# Patient Record
Sex: Male | Born: 1985
Health system: Southern US, Community
[De-identification: ages and names within clinical notes are randomized; demographics above are authoritative.]

## PROBLEM LIST (undated history)

## (undated) DIAGNOSIS — M549 Dorsalgia, unspecified: Secondary | ICD-10-CM

## (undated) DIAGNOSIS — G8929 Other chronic pain: Secondary | ICD-10-CM

## (undated) HISTORY — PX: TONSILLECTOMY: SUR1361

---
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
## 2007-06-01 ENCOUNTER — Emergency Department (HOSPITAL_COMMUNITY): Admission: EM | Admit: 2007-06-01 | Discharge: 2007-06-01 | Payer: Self-pay | Admitting: Emergency Medicine

## 2008-05-19 ENCOUNTER — Emergency Department (HOSPITAL_COMMUNITY): Admission: EM | Admit: 2008-05-19 | Discharge: 2008-05-19 | Payer: Self-pay | Admitting: Emergency Medicine

## 2009-12-27 ENCOUNTER — Emergency Department (HOSPITAL_BASED_OUTPATIENT_CLINIC_OR_DEPARTMENT_OTHER): Admission: EM | Admit: 2009-12-27 | Discharge: 2009-12-27 | Payer: Self-pay | Admitting: Emergency Medicine

## 2010-05-31 ENCOUNTER — Emergency Department (HOSPITAL_COMMUNITY): Admission: EM | Admit: 2010-05-31 | Discharge: 2010-05-31 | Payer: Self-pay | Admitting: Emergency Medicine

## 2011-01-02 ENCOUNTER — Emergency Department (HOSPITAL_BASED_OUTPATIENT_CLINIC_OR_DEPARTMENT_OTHER)
Admission: EM | Admit: 2011-01-02 | Discharge: 2011-01-02 | Disposition: A | Discharge status: Home / Self Care | Payer: Self-pay | Attending: Emergency Medicine | Admitting: Emergency Medicine

## 2011-01-02 DIAGNOSIS — M549 Dorsalgia, unspecified: Secondary | ICD-10-CM | POA: Insufficient documentation

## 2011-01-02 DIAGNOSIS — M79609 Pain in unspecified limb: Secondary | ICD-10-CM | POA: Insufficient documentation

## 2011-01-02 DIAGNOSIS — F17200 Nicotine dependence, unspecified, uncomplicated: Secondary | ICD-10-CM | POA: Insufficient documentation

## 2011-01-02 DIAGNOSIS — G8929 Other chronic pain: Secondary | ICD-10-CM | POA: Insufficient documentation

## 2011-01-02 DIAGNOSIS — J45909 Unspecified asthma, uncomplicated: Secondary | ICD-10-CM | POA: Insufficient documentation

## 2011-06-08 ENCOUNTER — Emergency Department (HOSPITAL_BASED_OUTPATIENT_CLINIC_OR_DEPARTMENT_OTHER)
Admission: EM | Admit: 2011-06-08 | Discharge: 2011-06-08 | Disposition: A | Discharge status: Home / Self Care | Payer: Managed Care, Other (non HMO) | Attending: Emergency Medicine | Admitting: Emergency Medicine

## 2011-06-08 ENCOUNTER — Encounter: Payer: Self-pay | Admitting: *Deleted

## 2011-06-08 DIAGNOSIS — G8929 Other chronic pain: Secondary | ICD-10-CM | POA: Insufficient documentation

## 2011-06-08 DIAGNOSIS — J45909 Unspecified asthma, uncomplicated: Secondary | ICD-10-CM | POA: Insufficient documentation

## 2011-06-08 DIAGNOSIS — M549 Dorsalgia, unspecified: Secondary | ICD-10-CM | POA: Insufficient documentation

## 2011-06-08 DIAGNOSIS — F17200 Nicotine dependence, unspecified, uncomplicated: Secondary | ICD-10-CM | POA: Insufficient documentation

## 2011-06-08 DIAGNOSIS — K922 Gastrointestinal hemorrhage, unspecified: Secondary | ICD-10-CM | POA: Insufficient documentation

## 2011-06-08 DIAGNOSIS — M5430 Sciatica, unspecified side: Secondary | ICD-10-CM | POA: Insufficient documentation

## 2011-06-08 HISTORY — DX: Dorsalgia, unspecified: M54.9

## 2011-06-08 HISTORY — DX: Other chronic pain: G89.29

## 2011-06-08 LAB — CBC
HCT: 47.2 % (ref 39.0–52.0)
Hemoglobin: 16.9 g/dL (ref 13.0–17.0)
MCH: 31.4 pg (ref 26.0–34.0)
MCHC: 35.8 g/dL (ref 30.0–36.0)
MCV: 87.7 fL (ref 78.0–100.0)
RBC: 5.38 MIL/uL (ref 4.22–5.81)

## 2011-06-08 MED ORDER — HYDROCODONE-ACETAMINOPHEN 5-325 MG PO TABS: 1.0000 | ORAL_TABLET | ORAL | Status: AC | PRN

## 2011-06-08 NOTE — ED Notes (Signed)
Lower back pain into his right leg. Hx of back problems for a couple of years.
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
## 2011-06-09 NOTE — ED Provider Notes (Signed)
  I performed a history and physical examination of Justin Arnold and discussed his management with Ruby Cola.  I agree with the history, physical, assessment, and plan of care, with the following exceptions: None  Neurologically intact with no signs cauda equina on exam.  Able to ambulate and has normal reflexes.  No abd pain. Sx control.  Rectal tone checked and found to be hemoccult positive.  hgb normal.  Instructed to follow up  I was present for the following procedures: None Time Spent in Critical Care of the patient: None  Tildon Husky, MD 06/09/11 [phone]

## 2011-06-14 ENCOUNTER — Other Ambulatory Visit: Payer: Self-pay | Admitting: Neurosurgery

## 2011-06-14 DIAGNOSIS — M545 Low back pain, unspecified: Secondary | ICD-10-CM

## 2011-06-21 ENCOUNTER — Ambulatory Visit
Admission: RE | Admit: 2011-06-21 | Discharge: 2011-06-21 | Disposition: A | Discharge status: Home / Self Care | Payer: Managed Care, Other (non HMO) | Source: Ambulatory Visit | Attending: Neurosurgery | Admitting: Neurosurgery

## 2011-06-21 DIAGNOSIS — M545 Low back pain, unspecified: Secondary | ICD-10-CM

## 2013-01-27 ENCOUNTER — Emergency Department (HOSPITAL_BASED_OUTPATIENT_CLINIC_OR_DEPARTMENT_OTHER)
Admission: EM | Admit: 2013-01-27 | Discharge: 2013-01-27 | Disposition: A | Discharge status: Home / Self Care | Payer: Managed Care, Other (non HMO) | Attending: Emergency Medicine | Admitting: Emergency Medicine

## 2013-01-27 ENCOUNTER — Encounter (HOSPITAL_BASED_OUTPATIENT_CLINIC_OR_DEPARTMENT_OTHER): Payer: Self-pay | Admitting: *Deleted

## 2013-01-27 DIAGNOSIS — J45909 Unspecified asthma, uncomplicated: Secondary | ICD-10-CM | POA: Insufficient documentation

## 2013-01-27 DIAGNOSIS — M79609 Pain in unspecified limb: Secondary | ICD-10-CM | POA: Insufficient documentation

## 2013-01-27 DIAGNOSIS — L255 Unspecified contact dermatitis due to plants, except food: Secondary | ICD-10-CM | POA: Insufficient documentation

## 2013-01-27 DIAGNOSIS — R21 Rash and other nonspecific skin eruption: Secondary | ICD-10-CM | POA: Insufficient documentation

## 2013-01-27 DIAGNOSIS — L237 Allergic contact dermatitis due to plants, except food: Secondary | ICD-10-CM

## 2013-01-27 DIAGNOSIS — F17200 Nicotine dependence, unspecified, uncomplicated: Secondary | ICD-10-CM | POA: Insufficient documentation

## 2013-01-27 DIAGNOSIS — M79601 Pain in right arm: Secondary | ICD-10-CM

## 2013-01-27 DIAGNOSIS — R209 Unspecified disturbances of skin sensation: Secondary | ICD-10-CM | POA: Insufficient documentation

## 2013-01-27 DIAGNOSIS — Z8739 Personal history of other diseases of the musculoskeletal system and connective tissue: Secondary | ICD-10-CM | POA: Insufficient documentation

## 2013-01-27 MED ORDER — PREDNISONE 10 MG PO TABS: ORAL_TABLET | ORAL | Status: DC

## 2013-01-27 MED ORDER — METHYLPREDNISOLONE SODIUM SUCC 125 MG IJ SOLR
125.0000 mg | Freq: Once | INTRAMUSCULAR | Status: AC
  Administered 2013-01-27: 125 mg via INTRAMUSCULAR
  Filled 2013-01-27: qty 2

## 2013-01-27 MED ORDER — HYDROCODONE-ACETAMINOPHEN 5-325 MG PO TABS: 2.0000 | ORAL_TABLET | ORAL | Status: DC | PRN

## 2013-01-27 NOTE — ED Provider Notes (Signed)
Medical screening examination/treatment/procedure(s) were performed by non-physician practitioner and as supervising physician I was immediately available for consultation/collaboration.   Carleene Cooper III, MD 01/27/13 [phone]

## 2013-01-27 NOTE — ED Notes (Addendum)
Pt c/o poison ivy  X 3 days, also requesting referral to ortho for back pain

## 2013-01-27 NOTE — ED Provider Notes (Signed)
History     CSN: [id number]  Arrival date & time 01/27/13  1840   First MD Initiated Contact with Patient 01/27/13 2012      Chief Complaint  Patient presents with  . Poison Ivy    (Consider location/radiation/quality/duration/timing/severity/associated sxs/prior treatment) Patient is a 27 y.o. male presenting with poison ivy. The history is provided by the patient. No language interpreter was used.  Poison Justin Arnold This is a new problem. Episode onset: 3 days. The problem occurs constantly. The problem has been gradually worsening. Associated symptoms include a rash. Exacerbated by: lifting. He has tried nothing for the symptoms.   Pt also complains of pain in right arm and numbness.  Pt reports feels like the pain he had when he had sciatica in his leg.  Pt has poison ivy all over body.   Past Medical History  Diagnosis Date  . Chronic back pain   . Asthma     Past Surgical History  Procedure Laterality Date  . Tonsillectomy      History reviewed. No pertinent family history.  History  Substance Use Topics  . Smoking status: Current Every Day Smoker -- 0.50 packs/day    Types: Cigarettes  . Smokeless tobacco: Not on file  . Alcohol Use: No      Review of Systems  Skin: Positive for rash.  All other systems reviewed and are negative.    Allergies  Review of patient's allergies indicates no known allergies.  Home Medications  No current outpatient prescriptions on file.  BP 155/73  Pulse 83  Temp(Src) 98.5 F (36.9 C) (Oral)  Ht 5\' 9"  (1.753 m)  Wt 249 lb (112.946 kg)  BMI 36.75 kg/m2  SpO2 100%  Physical Exam  Nursing note and vitals reviewed. Constitutional: He appears well-developed and well-nourished.  HENT:  Head: Normocephalic and atraumatic.  Eyes: Conjunctivae are normal. Pupils are equal, round, and reactive to light.  Neck: Normal range of motion.  Cardiovascular: Normal rate and normal heart sounds.   Pulmonary/Chest: Effort normal.   Musculoskeletal: Normal range of motion.  Neurological: He is alert.  Skin: Rash noted. There is erythema.  Linear rashed rash, face to legs,  Psychiatric: He has a normal mood and affect.    ED Course  Procedures (including critical care time)  Labs Reviewed - No data to display No results found.   No diagnosis found.    MDM  Solumedrol IM.    Pt given rx for 12 day taper dose.   I will give a few hydrocodone for pain.   Pt referred to Dr. Ranell Patrick for evaluation        Elson Areas, PA-C 01/27/13 2029

## 2013-05-27 ENCOUNTER — Emergency Department (HOSPITAL_BASED_OUTPATIENT_CLINIC_OR_DEPARTMENT_OTHER)
Admission: EM | Admit: 2013-05-27 | Discharge: 2013-05-27 | Disposition: A | Discharge status: Home / Self Care | Payer: Managed Care, Other (non HMO) | Attending: Emergency Medicine | Admitting: Emergency Medicine

## 2013-05-27 ENCOUNTER — Encounter (HOSPITAL_BASED_OUTPATIENT_CLINIC_OR_DEPARTMENT_OTHER): Payer: Self-pay | Admitting: *Deleted

## 2013-05-27 DIAGNOSIS — J45909 Unspecified asthma, uncomplicated: Secondary | ICD-10-CM | POA: Insufficient documentation

## 2013-05-27 DIAGNOSIS — M545 Low back pain, unspecified: Secondary | ICD-10-CM | POA: Insufficient documentation

## 2013-05-27 DIAGNOSIS — G8911 Acute pain due to trauma: Secondary | ICD-10-CM | POA: Insufficient documentation

## 2013-05-27 DIAGNOSIS — F17200 Nicotine dependence, unspecified, uncomplicated: Secondary | ICD-10-CM | POA: Insufficient documentation

## 2013-05-27 MED ORDER — OXYCODONE-ACETAMINOPHEN 5-325 MG PO TABS
1.0000 | ORAL_TABLET | Freq: Once | ORAL | Status: AC
  Administered 2013-05-27: 1 via ORAL
  Filled 2013-05-27 (×2): qty 1

## 2013-05-27 MED ORDER — IBUPROFEN 600 MG PO TABS: 600.0000 mg | ORAL_TABLET | Freq: Three times a day (TID) | ORAL | Status: DC | PRN

## 2013-05-27 MED ORDER — IBUPROFEN 400 MG PO TABS
600.0000 mg | ORAL_TABLET | Freq: Once | ORAL | Status: AC
  Administered 2013-05-27: 600 mg via ORAL
  Filled 2013-05-27: qty 1

## 2013-05-27 MED ORDER — METHOCARBAMOL 500 MG PO TABS: 500.0000 mg | ORAL_TABLET | Freq: Two times a day (BID) | ORAL | Status: DC
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
## 2013-05-27 NOTE — ED Notes (Signed)
MD at bedside. 

## 2013-08-17 ENCOUNTER — Emergency Department (HOSPITAL_BASED_OUTPATIENT_CLINIC_OR_DEPARTMENT_OTHER)
Admission: EM | Admit: 2013-08-17 | Discharge: 2013-08-17 | Disposition: A | Discharge status: Home / Self Care | Payer: Managed Care, Other (non HMO) | Attending: Emergency Medicine | Admitting: Emergency Medicine

## 2013-08-17 ENCOUNTER — Encounter (HOSPITAL_BASED_OUTPATIENT_CLINIC_OR_DEPARTMENT_OTHER): Payer: Self-pay | Admitting: Emergency Medicine

## 2013-08-17 DIAGNOSIS — K089 Disorder of teeth and supporting structures, unspecified: Secondary | ICD-10-CM | POA: Insufficient documentation

## 2013-08-17 DIAGNOSIS — K047 Periapical abscess without sinus: Secondary | ICD-10-CM

## 2013-08-17 DIAGNOSIS — K044 Acute apical periodontitis of pulpal origin: Secondary | ICD-10-CM | POA: Insufficient documentation

## 2013-08-17 DIAGNOSIS — K0889 Other specified disorders of teeth and supporting structures: Secondary | ICD-10-CM

## 2013-08-17 DIAGNOSIS — F17200 Nicotine dependence, unspecified, uncomplicated: Secondary | ICD-10-CM | POA: Insufficient documentation

## 2013-08-17 DIAGNOSIS — J45909 Unspecified asthma, uncomplicated: Secondary | ICD-10-CM | POA: Insufficient documentation

## 2013-08-17 DIAGNOSIS — Z8739 Personal history of other diseases of the musculoskeletal system and connective tissue: Secondary | ICD-10-CM | POA: Insufficient documentation

## 2013-08-17 MED ORDER — HYDROCODONE-ACETAMINOPHEN 5-325 MG PO TABS: 1.0000 | ORAL_TABLET | ORAL | Status: DC | PRN

## 2013-08-17 MED ORDER — HYDROCODONE-ACETAMINOPHEN 5-325 MG PO TABS
2.0000 | ORAL_TABLET | Freq: Once | ORAL | Status: AC
  Administered 2013-08-17: 2 via ORAL
  Filled 2013-08-17: qty 2

## 2013-08-17 MED ORDER — AMOXICILLIN 500 MG PO CAPS: 500.0000 mg | ORAL_CAPSULE | Freq: Three times a day (TID) | ORAL | Status: DC

## 2013-08-17 NOTE — ED Notes (Signed)
C/o dental pain to lower left molar x several days.  Reports difficulty eating.

## 2013-08-17 NOTE — ED Provider Notes (Signed)
CSN: [id number]     Arrival date & time 08/17/13  1510 History   First MD Initiated Contact with Patient 08/17/13 1517     Chief Complaint  Patient presents with  . Dental Pain   (Consider location/radiation/quality/duration/timing/severity/associated sxs/prior Treatment) HPI Comments: Patient is a 27 year old male who presents to the emergency department complaining of right lower dental pain x3 days. Pain radiating throughout his lower jaw, described as sharp rated 10 out of 10, worse with eating. No difficulty swallowing. States the right side of his face looks swollen. Denies fever or chills. He has tried using Orajel without relief.  Patient is a 27 y.o. male presenting with tooth pain. The history is provided by the patient and a relative.  Dental Pain   Past Medical History  Diagnosis Date  . Chronic back pain   . Asthma    Past Surgical History  Procedure Laterality Date  . Tonsillectomy     No family history on file. History  Substance Use Topics  . Smoking status: Current Every Day Smoker -- 0.50 packs/day    Types: Cigarettes  . Smokeless tobacco: Not on file  . Alcohol Use: No    Review of Systems  HENT: Positive for dental problem.   All other systems reviewed and are negative.    Allergies  Review of patient's allergies indicates no known allergies.  Home Medications   Current Outpatient Rx  Name  Route  Sig  Dispense  Refill  . amoxicillin (AMOXIL) 500 MG capsule   Oral   Take 1 capsule (500 mg total) by mouth 3 (three) times daily.   21 capsule   0   . HYDROcodone-acetaminophen (NORCO/VICODIN) 5-325 MG per tablet   Oral   Take 1-2 tablets by mouth every 4 (four) hours as needed for pain.   6 tablet   0    There were no vitals taken for this visit. Physical Exam  Nursing note and vitals reviewed. Constitutional: He is oriented to person, place, and time. He appears well-developed and well-nourished. No distress.  HENT:  Head:  Normocephalic and atraumatic.  Mouth/Throat: Oropharynx is clear and moist.  Poor dentition throughout. TTP of right lower second molar with surrounding erythema and edema. No abscess.  Eyes: Conjunctivae are normal.  Neck: Normal range of motion. Neck supple.  Cardiovascular: Normal rate, regular rhythm and normal heart sounds.   Pulmonary/Chest: Effort normal and breath sounds normal.  Musculoskeletal: Normal range of motion. He exhibits no edema.  Lymphadenopathy:       Head (right side): Submandibular adenopathy present.  Neurological: He is alert and oriented to person, place, and time.  Skin: Skin is warm and dry. He is not diaphoretic.  Psychiatric: He has a normal mood and affect. His behavior is normal.    ED Course  Procedures (including critical care time) Labs Review Labs Reviewed - No data to display Imaging Review No results found.  EKG Interpretation   None       MDM   1. Pain, dental   2. Dental infection     Dental pain associated with dental infection. No evidence of dental abscess. Patient is afebrile, non toxic appearing and swallowing secretions well. I gave patient referral to dentist and stressed the importance of dental follow up for ultimate management of dental pain. I will also give amoxicillin and pain control. Patient voices understanding and is agreeable to plan.     Trevor Mace, PA-C 08/17/13 1528

## 2013-08-17 NOTE — ED Provider Notes (Signed)
Medical screening examination/treatment/procedure(s) were performed by non-physician practitioner and as supervising physician I was immediately available for consultation/collaboration.  EKG Interpretation   None         Soma Lizak B. Bernette Mayers, MD 08/17/13 1550

## 2013-10-09 ENCOUNTER — Encounter (HOSPITAL_BASED_OUTPATIENT_CLINIC_OR_DEPARTMENT_OTHER): Payer: Self-pay | Admitting: Emergency Medicine

## 2013-10-09 ENCOUNTER — Emergency Department (HOSPITAL_BASED_OUTPATIENT_CLINIC_OR_DEPARTMENT_OTHER)
Admission: EM | Admit: 2013-10-09 | Discharge: 2013-10-09 | Disposition: A | Discharge status: Home / Self Care | Payer: Managed Care, Other (non HMO) | Attending: Emergency Medicine | Admitting: Emergency Medicine

## 2013-10-09 DIAGNOSIS — F17200 Nicotine dependence, unspecified, uncomplicated: Secondary | ICD-10-CM | POA: Insufficient documentation

## 2013-10-09 DIAGNOSIS — K0889 Other specified disorders of teeth and supporting structures: Secondary | ICD-10-CM

## 2013-10-09 DIAGNOSIS — K029 Dental caries, unspecified: Secondary | ICD-10-CM

## 2013-10-09 DIAGNOSIS — K089 Disorder of teeth and supporting structures, unspecified: Secondary | ICD-10-CM | POA: Insufficient documentation

## 2013-10-09 DIAGNOSIS — Z792 Long term (current) use of antibiotics: Secondary | ICD-10-CM | POA: Insufficient documentation

## 2013-10-09 DIAGNOSIS — R599 Enlarged lymph nodes, unspecified: Secondary | ICD-10-CM | POA: Insufficient documentation

## 2013-10-09 DIAGNOSIS — G8929 Other chronic pain: Secondary | ICD-10-CM | POA: Insufficient documentation

## 2013-10-09 DIAGNOSIS — J45909 Unspecified asthma, uncomplicated: Secondary | ICD-10-CM | POA: Insufficient documentation

## 2013-10-09 MED ORDER — HYDROCODONE-ACETAMINOPHEN 5-325 MG PO TABS: 1.0000 | ORAL_TABLET | ORAL | Status: DC | PRN

## 2013-10-09 MED ORDER — HYDROCODONE-ACETAMINOPHEN 5-325 MG PO TABS
1.0000 | ORAL_TABLET | Freq: Once | ORAL | Status: AC
  Administered 2013-10-09: 1 via ORAL
  Filled 2013-10-09: qty 1

## 2013-10-09 MED ORDER — PENICILLIN V POTASSIUM 500 MG PO TABS: 500.0000 mg | ORAL_TABLET | Freq: Three times a day (TID) | ORAL | Status: DC

## 2013-10-09 MED ORDER — PENICILLIN V POTASSIUM 250 MG PO TABS
500.0000 mg | ORAL_TABLET | Freq: Once | ORAL | Status: AC
  Administered 2013-10-09: 500 mg via ORAL
  Filled 2013-10-09: qty 2

## 2013-10-09 NOTE — ED Notes (Signed)
Pt amb to triage with quick steady gait in nad. Pt reports tooth pain x 2 days, states he owes his dentist money so they won't see him.

## 2013-10-09 NOTE — ED Provider Notes (Signed)
CSN: [id number]     Arrival date & time 10/09/13  1741 History   First MD Initiated Contact with Patient 10/09/13 1756     Chief Complaint  Patient presents with  . Dental Pain   (Consider location/radiation/quality/duration/timing/severity/associated sxs/prior Treatment) Patient is a 27 y.o. male presenting with tooth pain. The history is provided by the patient. No language interpreter was used.  Dental Pain Location:  Upper Upper teeth location:  2/RU 2nd molar and 3/RU 1st molar Quality:  Pulsating and sharp Severity:  Moderate Associated symptoms: no facial swelling and no fever   Associated symptoms comment:  Dental pain upper right molars for the past 2 days without known injury or fracture. He denies fever. No difficulty swallowing. No drainage into his mouth.   Past Medical History  Diagnosis Date  . Chronic back pain   . Asthma    Past Surgical History  Procedure Laterality Date  . Tonsillectomy     History reviewed. No pertinent family history. History  Substance Use Topics  . Smoking status: Current Every Day Smoker -- 0.50 packs/day    Types: Cigarettes  . Smokeless tobacco: Not on file  . Alcohol Use: No    Review of Systems  Constitutional: Negative for fever and chills.  HENT: Negative for ear pain, facial swelling and trouble swallowing.        Toothache.  Gastrointestinal: Negative.   Musculoskeletal: Negative for myalgias.    Allergies  Review of patient's allergies indicates no known allergies.  Home Medications   Current Outpatient Rx  Name  Route  Sig  Dispense  Refill  . amoxicillin (AMOXIL) 500 MG capsule   Oral   Take 1 capsule (500 mg total) by mouth 3 (three) times daily.   21 capsule   0   . HYDROcodone-acetaminophen (NORCO/VICODIN) 5-325 MG per tablet   Oral   Take 1-2 tablets by mouth every 4 (four) hours as needed for pain.   6 tablet   0    BP 143/73  Pulse 83  Temp(Src) 98.5 F (36.9 C) (Oral)  Resp 18  Ht 5\' 9"   (1.753 m)  Wt 230 lb (104.327 kg)  BMI 33.95 kg/m2  SpO2 100% Physical Exam  Constitutional: He is oriented to person, place, and time. He appears well-developed and well-nourished.  HENT:  Widespread dental decay. Significant tartar build up on upper molars generally, with redness and tenderness to gingiva on right upper ridge.   Neck: Normal range of motion.  Pulmonary/Chest: Effort normal.  Musculoskeletal: Normal range of motion.  Lymphadenopathy:    He has cervical adenopathy.  Neurological: He is alert and oriented to person, place, and time.  Skin: Skin is warm and dry.  Psychiatric: He has a normal mood and affect.    ED Course  Procedures (including critical care time) Labs Review Labs Reviewed - No data to display Imaging Review No results found.  EKG Interpretation   None       MDM  No diagnosis found. 1. Dental decay 2. Dental pain  Uncomplicated dental pain with significant decay.    Arnoldo Hooker, PA-C 10/09/13 [phone]

## 2013-10-09 NOTE — ED Provider Notes (Signed)
Medical screening examination/treatment/procedure(s) were performed by non-physician practitioner and as supervising physician I was immediately available for consultation/collaboration.  EKG Interpretation   None         Nazly Digilio, MD 10/09/13 1845 

## 2014-01-11 ENCOUNTER — Encounter (HOSPITAL_BASED_OUTPATIENT_CLINIC_OR_DEPARTMENT_OTHER): Payer: Self-pay | Admitting: Emergency Medicine

## 2014-01-11 ENCOUNTER — Emergency Department (HOSPITAL_BASED_OUTPATIENT_CLINIC_OR_DEPARTMENT_OTHER)
Admission: EM | Admit: 2014-01-11 | Discharge: 2014-01-11 | Disposition: A | Discharge status: Home / Self Care | Payer: Managed Care, Other (non HMO) | Attending: Emergency Medicine | Admitting: Emergency Medicine

## 2014-01-11 DIAGNOSIS — J45909 Unspecified asthma, uncomplicated: Secondary | ICD-10-CM | POA: Insufficient documentation

## 2014-01-11 DIAGNOSIS — K029 Dental caries, unspecified: Secondary | ICD-10-CM | POA: Insufficient documentation

## 2014-01-11 DIAGNOSIS — K089 Disorder of teeth and supporting structures, unspecified: Secondary | ICD-10-CM | POA: Insufficient documentation

## 2014-01-11 DIAGNOSIS — Z792 Long term (current) use of antibiotics: Secondary | ICD-10-CM | POA: Insufficient documentation

## 2014-01-11 DIAGNOSIS — F17200 Nicotine dependence, unspecified, uncomplicated: Secondary | ICD-10-CM | POA: Insufficient documentation

## 2014-01-11 DIAGNOSIS — G8929 Other chronic pain: Secondary | ICD-10-CM | POA: Insufficient documentation

## 2014-01-11 DIAGNOSIS — K0889 Other specified disorders of teeth and supporting structures: Secondary | ICD-10-CM

## 2014-01-11 MED ORDER — HYDROCODONE-ACETAMINOPHEN 5-325 MG PO TABS: 1.0000 | ORAL_TABLET | ORAL | Status: DC | PRN

## 2014-01-11 MED ORDER — AMOXICILLIN 500 MG PO CAPS: 500.0000 mg | ORAL_CAPSULE | Freq: Three times a day (TID) | ORAL | Status: DC

## 2014-01-11 NOTE — Discharge Instructions (Signed)

## 2014-01-11 NOTE — ED Provider Notes (Signed)
CSN: [id number]     Arrival date & time 01/11/14  1500 History   First MD Initiated Contact with Patient 01/11/14 1708     Chief Complaint  Patient presents with  . Dental Pain     (Consider location/radiation/quality/duration/timing/severity/associated sxs/prior Treatment) Patient is a [age] y.o. male presenting with tooth pain. The history is provided by the patient. No language interpreter was used.  Dental Pain Location:  Lower Quality:  Aching Severity:  Moderate Onset quality:  Gradual Timing:  Constant Progression:  Worsening Chronicity:  New Relieved by:  Nothing Ineffective treatments:  None tried Associated symptoms: gum swelling   Pt complains of dental pain.   Pt seen here in Dec and had 2 teeth pulled.  Pt has continued problems with multiple teeth.  Past Medical History  Diagnosis Date  . Chronic back pain   . Asthma    Past Surgical History  Procedure Laterality Date  . Tonsillectomy     No family history on file. History  Substance Use Topics  . Smoking status: Current Every Day Smoker -- 0.50 packs/day    Types: Cigarettes  . Smokeless tobacco: Never Used  . Alcohol Use: No    Review of Systems  HENT: Positive for dental problem.   All other systems reviewed and are negative.      Allergies  Review of patient's allergies indicates no known allergies.  Home Medications   Current Outpatient Rx  Name  Route  Sig  Dispense  Refill  . amoxicillin (AMOXIL) 500 MG capsule   Oral   Take 1 capsule (500 mg total) by mouth 3 (three) times daily.   21 capsule   0   . HYDROcodone-acetaminophen (NORCO/VICODIN) 5-325 MG per tablet   Oral   Take 1-2 tablets by mouth every 4 (four) hours as needed for pain.   6 tablet   0   . HYDROcodone-acetaminophen (NORCO/VICODIN) 5-325 MG per tablet   Oral   Take 1-2 tablets by mouth every 4 (four) hours as needed.   12 tablet   0   . penicillin v potassium (VEETID) 500 MG tablet   Oral   Take 1 tablet  (500 mg total) by mouth 3 (three) times daily.   30 tablet   0    BP 150/74  Pulse 70  Temp(Src) 98.8 F (37.1 C) (Oral)  Resp 20  Ht 5\' 9"  (1.753 m)  Wt 230 lb (104.327 kg)  BMI 33.95 kg/m2  SpO2 100% Physical Exam  Constitutional: He appears well-developed and well-nourished.  HENT:  Head: Normocephalic and atraumatic.  Multiple dental caries, gingival swelling  Eyes: Conjunctivae are normal. Pupils are equal, round, and reactive to light.  Cardiovascular: Normal rate.   Pulmonary/Chest: Effort normal.  Musculoskeletal: Normal range of motion.  Neurological: He is alert.  Skin: Skin is warm.    ED Course  Procedures (including critical care time) Labs Review Labs Reviewed - No data to display Imaging Review No results found.   EKG Interpretation None      MDM   Final diagnoses:  Toothache    amoxicillian and hydrocodone.   I advised pt he needs to see dentist.  No dental call currently.   Pt given number for Dr. Nance Pewivils    Labria Wos K Altagracia Rone, PA-C 01/11/14 1732

## 2014-01-11 NOTE — ED Notes (Signed)
Right side dental since middle of last week

## 2014-01-12 NOTE — ED Provider Notes (Signed)
Medical screening examination/treatment/procedure(s) were performed by non-physician practitioner and as supervising physician I was immediately available for consultation/collaboration.    Toshua Honsinger E Sohrab Keelan, MD 01/12/14 1506 

## 2017-03-22 ENCOUNTER — Encounter (HOSPITAL_BASED_OUTPATIENT_CLINIC_OR_DEPARTMENT_OTHER): Payer: Self-pay | Admitting: *Deleted

## 2017-03-22 ENCOUNTER — Emergency Department (HOSPITAL_BASED_OUTPATIENT_CLINIC_OR_DEPARTMENT_OTHER)
Admission: EM | Admit: 2017-03-22 | Discharge: 2017-03-22 | Disposition: A | Discharge status: Home / Self Care | Payer: Managed Care, Other (non HMO) | Attending: Emergency Medicine | Admitting: Emergency Medicine

## 2017-03-22 DIAGNOSIS — K0889 Other specified disorders of teeth and supporting structures: Secondary | ICD-10-CM | POA: Insufficient documentation

## 2017-03-22 DIAGNOSIS — F17210 Nicotine dependence, cigarettes, uncomplicated: Secondary | ICD-10-CM | POA: Insufficient documentation

## 2017-03-22 DIAGNOSIS — J45909 Unspecified asthma, uncomplicated: Secondary | ICD-10-CM | POA: Insufficient documentation

## 2017-03-22 MED ORDER — BUPIVACAINE-EPINEPHRINE (PF) 0.5% -1:200000 IJ SOLN
1.8000 mL | Freq: Once | INTRAMUSCULAR | Status: AC
  Administered 2017-03-22: 1.8 mL
  Filled 2017-03-22: qty 1.8

## 2017-03-22 MED ORDER — PENICILLIN V POTASSIUM 500 MG PO TABS: 500.0000 mg | ORAL_TABLET | Freq: Two times a day (BID) | ORAL | 0 refills | Status: AC

## 2017-03-22 MED ORDER — IBUPROFEN 600 MG PO TABS: 600.0000 mg | ORAL_TABLET | Freq: Four times a day (QID) | ORAL | 0 refills | Status: DC | PRN

## 2017-03-22 NOTE — ED Notes (Signed)
ED Provider at bedside. 

## 2017-03-22 NOTE — ED Notes (Signed)
Patient's tooth broke last while [facility].  Per patient, his dentist in [place] told him that all of his teeth needs to be pulled and this was 2 years ago. Patient took Aleve last without any relief.

## 2017-03-22 NOTE — ED Triage Notes (Signed)
Swelling to the left side of his face since this am. States his left upper molar broke last night.

## 2017-03-22 NOTE — ED Provider Notes (Signed)
[facility] Provider Note   CSN: [id number] Arrival date & time: 03/22/17  1844   By signing my name below, I, [name], attest that this documentation has been prepared under the direction and in the presence of Melburn Hake, [place]. Electronically Signed: [name], Scribe. 03/22/17. 9:05 PM.   History   Chief Complaint Chief Complaint  Patient presents with  . Dental Pain   The history is provided by the patient and medical records. No language interpreter was used.    Justin Arnold is a 31 y.o. male with h/o poor dentition presenting to the Emergency Department with chief complaint of L upper dental pain gradually wosening since last night. He states a tooth chipped last night while eating. Swelling noted to affected area. he also notes the sensation of a pocket to the affected area and sour taste sensation. He describes 8/10 constant tight throbbing worse with eating and contact. No other modifying factors noted. He states he has been told in the past that he would need to get "all of [his] teeth pulled" in the past. No difficulty swallowing, vomiting, difficulty breathing or fever. No dental specialist noted for F/U. No other complaints at this time.    Past Medical History:  Diagnosis Date  . Asthma   . Chronic back pain     There are no active problems to display for this patient.   Past Surgical History:  Procedure Laterality Date  . TONSILLECTOMY         Home Medications    Prior to Admission medications   Medication Sig Start Date End Date Taking? Authorizing Provider  amoxicillin (AMOXIL) 500 MG capsule Take 1 capsule (500 mg total) by mouth 3 (three) times daily. 01/11/14   Elson Areas, PA-C  HYDROcodone-acetaminophen (NORCO/VICODIN) 5-325 MG per tablet Take 1-2 tablets by mouth every 4 (four) hours as needed for pain. 08/17/13   Hess, Nada Boozer, PA-C  HYDROcodone-acetaminophen (NORCO/VICODIN) 5-325 MG per tablet Take 1-2  tablets by mouth every 4 (four) hours as needed. 01/11/14   Elson Areas, PA-C  ibuprofen (ADVIL,MOTRIN) 600 MG tablet Take 1 tablet (600 mg total) by mouth every 6 (six) hours as needed. 03/22/17   Barrett Henle, PA-C  penicillin v potassium (VEETID) 500 MG tablet Take 1 tablet (500 mg total) by mouth 2 (two) times daily. 03/22/17 03/29/17  Barrett Henle, PA-C    Family History No family history on file.  Social History Social History  Substance Use Topics  . Smoking status: Current Every Day Smoker    Packs/day: 0.50    Types: Cigarettes  . Smokeless tobacco: Never Used  . Alcohol use No     Allergies   Patient has no known allergies.   Review of Systems Review of Systems  Constitutional: Negative for fever.  HENT: Positive for dental problem, facial swelling and mouth sores. Negative for trouble swallowing.   Respiratory: Negative for shortness of breath.   Gastrointestinal: Negative for nausea and vomiting.  All other systems reviewed and are negative.    Physical Exam Updated Vital Signs BP 139/75 (BP Location: Left Arm)   Pulse 98   Temp 97.9 F (36.6 C) (Oral)   Resp 16   Ht 5\' 9"  (1.753 m)   Wt 230 lb (104.3 kg)   SpO2 100%   BMI 33.97 kg/m   Physical Exam  Constitutional: He is oriented to person, place, and time. He appears well-developed and well-nourished. No distress.  HENT:  Head: Normocephalic and atraumatic.  Mouth/Throat: Uvula is midline, oropharynx is clear and moist and mucous membranes are normal. No oral lesions. No trismus in the jaw. Abnormal dentition. Dental caries present. No dental abscesses or uvula swelling. No oropharyngeal exudate, posterior oropharyngeal edema, posterior oropharyngeal erythema or tonsillar abscesses. No tonsillar exudate.    No trismus, drooling, facial/neck swelling or stridor on exam. No muffled voice. Floor of mouth soft. No facial or neck swelling.  Eyes: Conjunctivae and EOM are normal.  Right eye exhibits no discharge. Left eye exhibits no discharge. No scleral icterus.  Pulmonary/Chest: Effort normal.  Neurological: He is alert and oriented to person, place, and time.  Skin: He is not diaphoretic.  Nursing note and vitals reviewed.    ED Treatments / Results  DIAGNOSTIC STUDIES: Oxygen Saturation is 100% on RA, NL by my interpretation.    COORDINATION OF CARE: 8:58 PM-Discussed next steps with pt. Pt verbalized understanding and is agreeable with the plan. Will order medications and provide dental resources.   Labs (all labs ordered are listed, but only abnormal results are displayed) Labs Reviewed - No data to display  EKG  EKG Interpretation None       Radiology No results found.  Procedures .Nerve Block Date/Time: 03/22/2017 9:20 PM Performed by: Barrett HenleNADEAU, Akayla Brass ELIZABETH Authorized by: Barrett HenleNADEAU, Josephmichael Lisenbee ELIZABETH   Consent:    Consent obtained:  Verbal   Consent given by:  Patient Indications:    Indications:  Pain relief Location:    Nerve block body site: dental block. Procedure details (see MAR for exact dosages):    Block needle gauge:  27 G   Anesthetic injected:  Bupivacaine 0.5% WITH epi   Injection procedure:  Anatomic landmarks identified Post-procedure details:    Dressing:  None   Outcome:  Pain improved   Patient tolerance of procedure:  Tolerated well, no immediate complications    (including critical care time)  Medications Ordered in ED Medications  bupivacaine-epinephrine (MARCAINE W/ EPI) 0.5% -1:200000 injection 1.8 mL (1.8 mLs Infiltration Given by Other 03/22/17 2121)     Initial Impression / Assessment and Plan / ED Course  I have reviewed the triage vital signs and the nursing notes.  Pertinent labs & imaging results that were available during my care of the patient were reviewed by me and considered in my medical decision making (see chart for details).     Patient with toothache.  No gross abscess.  Exam  unconcerning for Ludwig's angina or spread of infection.  Will treat with penicillin and pain medicine.  Urged patient to follow-up with dentist.     Final Clinical Impressions(s) / ED Diagnoses   Final diagnoses:  Pain, dental    New Prescriptions New Prescriptions   IBUPROFEN (ADVIL,MOTRIN) 600 MG TABLET    Take 1 tablet (600 mg total) by mouth every 6 (six) hours as needed.   PENICILLIN V POTASSIUM (VEETID) 500 MG TABLET    Take 1 tablet (500 mg total) by mouth 2 (two) times daily.   I personally performed the services described in this documentation, which was scribed in my presence. The recorded information has been reviewed and is accurate.    Barrett Henleadeau, Cliffton Spradley Elizabeth, PA-C 03/22/17 2127    Tegeler, Canary Brimhristopher J, MD 03/23/17 0100

## 2017-03-22 NOTE — Discharge Instructions (Signed)
Take medications as prescribed. You may also take 600 mg ibuprofen 4 times daily as needed for pain relief. I recommend eating prior to taking ibuprofen to prevent gastrointestinal side effects. You may apply ice to affected area for 15-20 minutes 3-4 times daily to help with pain. °Follow-up with one of the dental clinics listed below for further management of your dental pain. °Return to the emergency department if symptoms worsen or new onset of fever, headache, neck stiffness, facial/neck swelling, unable to open jaw, unable to swallow resulting in drooling, difficulty breathing, drainage, unable to tolerate fluids.  ° °[employer] °[employer] °[facility] °[address] °[place], [place] [address] °Phone [phone] ° °[facility] [employer] [facility] in [place], [place], exemplifies the Dental School?s vision to improve the health and quality of life of all [place] by creating leaders with a passion to care for the underserved and by leading the nation in community-based, service learning oral health education. ° °We are committed to offering comprehensive general dental services for adults, children and special needs patients in a safe, caring and professional setting. ° ° °Appointments: Our clinic is open Monday through Friday 8:00 a.m. until 5:00 p.m. The amount of time scheduled for an appointment depends on the patient?s specific needs. We ask that you keep your appointed time for care or provide 24-hour notice of all appointment changes. Parents or legal guardians must accompany minor children. °  °Payment for Services: Medicaid and other insurance plans are welcome. Payment for services is due when services are rendered and may be made by cash or credit card. If you have dental insurance, we will assist you with your claim submission. °   °Emergencies:   Emergency services will be provided Monday through Friday on a walk-in basis.  Please arrive early for emergency services. After hours emergency services will be provided for patients of record as required. °  °Services:  °Comprehensive General Dentistry °Children?s Dentistry °Oral Surgery - Extractions °Root Canals °Sealants and Tooth Colored Fillings °Crowns and Bridges °Dentures and Partial Dentures °Implant Services °Periodontal Services and Cleanings °Cosmetic Tooth Whitening °Digital Radiography °3-D/[facility]  °

## 2017-10-11 ENCOUNTER — Other Ambulatory Visit: Payer: Self-pay

## 2017-10-11 ENCOUNTER — Encounter (HOSPITAL_BASED_OUTPATIENT_CLINIC_OR_DEPARTMENT_OTHER): Payer: Self-pay

## 2017-10-11 ENCOUNTER — Emergency Department (HOSPITAL_BASED_OUTPATIENT_CLINIC_OR_DEPARTMENT_OTHER)
Admission: EM | Admit: 2017-10-11 | Discharge: 2017-10-11 | Disposition: A | Discharge status: Home / Self Care | Payer: Managed Care, Other (non HMO) | Attending: Emergency Medicine | Admitting: Emergency Medicine

## 2017-10-11 DIAGNOSIS — J45909 Unspecified asthma, uncomplicated: Secondary | ICD-10-CM | POA: Insufficient documentation

## 2017-10-11 DIAGNOSIS — F17210 Nicotine dependence, cigarettes, uncomplicated: Secondary | ICD-10-CM | POA: Insufficient documentation

## 2017-10-11 DIAGNOSIS — B9789 Other viral agents as the cause of diseases classified elsewhere: Secondary | ICD-10-CM

## 2017-10-11 DIAGNOSIS — J069 Acute upper respiratory infection, unspecified: Secondary | ICD-10-CM

## 2017-10-11 MED ORDER — ALBUTEROL SULFATE HFA 108 (90 BASE) MCG/ACT IN AERS
2.0000 | INHALATION_SPRAY | RESPIRATORY_TRACT | Status: DC | PRN
  Administered 2017-10-11: 2 via RESPIRATORY_TRACT
  Filled 2017-10-11: qty 6.7

## 2017-10-11 MED ORDER — BENZONATATE 100 MG PO CAPS: 100.0000 mg | ORAL_CAPSULE | Freq: Three times a day (TID) | ORAL | 0 refills | Status: DC

## 2017-10-11 MED ORDER — NAPROXEN 500 MG PO TABS: 500.0000 mg | ORAL_TABLET | Freq: Two times a day (BID) | ORAL | 0 refills | Status: DC

## 2017-10-11 NOTE — Discharge Instructions (Signed)
Please read and follow all provided instructions.  Your diagnoses today include:  1. Viral URI with cough     You appear to have an upper respiratory infection (URI). An upper respiratory tract infection, or cold, is a viral infection of the air passages leading to the lungs. It should improve gradually after 5-7 days. You may have a lingering cough that lasts for 2- 4 weeks after the infection.  Tests performed today include:  Vital signs. See below for your results today.   Medications prescribed:   Naproxen - anti-inflammatory pain medication  Do not exceed 500mg  naproxen every 12 hours, take with food  You have been prescribed an anti-inflammatory medication or NSAID. Take with food. Take smallest effective dose for the shortest duration needed for your pain. Stop taking if you experience stomach pain or vomiting.    Tessalon Perles - cough suppressant medication   Albuterol inhaler - medication that opens up your airway  Use inhaler as follows: 1-2 puffs with spacer every 4 hours as needed for wheezing, cough, or shortness of breath.   Take any prescribed medications only as directed. Treatment for your infection is aimed at treating the symptoms. There are no medications, such as antibiotics, that will cure your infection.   Home care instructions:  Follow any educational materials contained in this packet.   Your illness is contagious and can be spread to others, especially during the first 3 or 4 days. It cannot be cured by antibiotics or other medicines. Take basic precautions such as washing your hands often, covering your mouth when you cough or sneeze, and avoiding public places where you could spread your illness to others.   Please continue drinking plenty of fluids.  Use over-the-counter medicines as needed as directed on packaging for symptom relief.  You may also use ibuprofen or tylenol as directed on packaging for pain or fever.  Do not take multiple medicines  containing Tylenol or acetaminophen to avoid taking too much of this medication.  Follow-up instructions: Please follow-up with your primary care provider in the next 3 days for further evaluation of your symptoms if you are not feeling better.   Return instructions:   Please return to the Emergency Department if you experience worsening symptoms.   RETURN IMMEDIATELY IF you develop shortness of breath, confusion or altered mental status, a new rash, become dizzy, faint, or poorly responsive, or are unable to be cared for at home.  Please return if you have persistent vomiting and cannot keep down fluids or develop a fever that is not controlled by tylenol or motrin.    Please return if you have any other emergent concerns.  Additional Information:  Your vital signs today were: BP 137/83 (BP Location: Right Arm)    Pulse 100    Temp 97.6 F (36.4 C) (Oral)    Resp 18    Ht 5\' 8"  (1.727 m)    Wt 90.7 kg (200 lb)    SpO2 100%    BMI 30.41 kg/m  If your blood pressure (BP) was elevated above 135/85 this visit, please have this repeated by your doctor within one month. --------------

## 2017-10-11 NOTE — ED Triage Notes (Signed)
Pt c/o cough, congestion, runny nose x 3 days

## 2017-10-11 NOTE — ED Provider Notes (Signed)
[facility] EMERGENCY DEPARTMENT Provider Note   CSN: [id number] Arrival date & time: 10/11/17  1553     History   Chief Complaint Chief Complaint  Patient presents with  . URI    HPI Aldona LentoChristopher A Frasier is a 31 y.o. male.  Patient with history of asthma presents with 3-day history of ear pain, runny nose, nasal congestion, sore throat, cough and wheezing.  Patient denies fever, nausea, vomiting, or diarrhea.  No shortness of breath or abdominal pain.  No urinary symptoms or rash.  No treatments prior to arrival.  Patient used to be on albuterol but states that he currently does not have an inhaler.  He has been under a lot of stress recently due to the death of a family member.  No known sick contacts. The onset of this condition was acute. The course is constant. Aggravating factors: none. Alleviating factors: none.        Past Medical History:  Diagnosis Date  . Asthma   . Chronic back pain     There are no active problems to display for this patient.   Past Surgical History:  Procedure Laterality Date  . TONSILLECTOMY         Home Medications    Prior to Admission medications   Medication Sig Start Date End Date Taking? Authorizing Provider  benzonatate (TESSALON) 100 MG capsule Take 1 capsule (100 mg total) by mouth every 8 (eight) hours. 10/11/17   Renne CriglerGeiple, Kenndra Morris, PA-C  naproxen (NAPROSYN) 500 MG tablet Take 1 tablet (500 mg total) by mouth 2 (two) times daily. 10/11/17   Renne CriglerGeiple, Leaner Morici, PA-C    Family History No family history on file.  Social History Social History   Tobacco Use  . Smoking status: Current Every Day Smoker    Packs/day: 0.50    Types: Cigarettes  . Smokeless tobacco: Never Used  Substance Use Topics  . Alcohol use: No  . Drug use: No     Allergies   Patient has no known allergies.   Review of Systems Review of Systems  Constitutional: Negative for chills, fatigue and fever.  HENT: Positive for congestion,  ear pain, rhinorrhea and sore throat. Negative for sinus pressure.   Eyes: Negative for redness.  Respiratory: Positive for cough and wheezing.   Gastrointestinal: Negative for abdominal pain, diarrhea, nausea and vomiting.  Genitourinary: Negative for dysuria.  Musculoskeletal: Negative for myalgias and neck stiffness.  Skin: Negative for rash.  Neurological: Negative for headaches.  Hematological: Negative for adenopathy.     Physical Exam Updated Vital Signs BP 137/83 (BP Location: Right Arm)   Pulse 100   Temp 97.6 F (36.4 C) (Oral)   Resp 18   Ht 5\' 8"  (1.727 m)   Wt 90.7 kg (200 lb)   SpO2 100%   BMI 30.41 kg/m   Physical Exam  Constitutional: He appears well-developed and well-nourished.  HENT:  Head: Normocephalic and atraumatic.  Right Ear: Tympanic membrane, external ear and ear canal normal.  Left Ear: Tympanic membrane, external ear and ear canal normal.  Nose: Nose normal. No mucosal edema or rhinorrhea.  Mouth/Throat: Uvula is midline and mucous membranes are normal. Mucous membranes are not dry. No trismus in the jaw. No uvula swelling. Posterior oropharyngeal edema present. No oropharyngeal exudate, posterior oropharyngeal erythema or tonsillar abscesses. No tonsillar exudate.  Eyes: Conjunctivae are normal. Right eye exhibits no discharge. Left eye exhibits no discharge.  Neck: Normal range of motion. Neck supple.  Cardiovascular: Normal  rate, regular rhythm and normal heart sounds.  Pulmonary/Chest: Effort normal and breath sounds normal. No respiratory distress. He has no wheezes. He has no rales.  Abdominal: Soft. There is no tenderness.  Neurological: He is alert.  Skin: Skin is warm and dry.  Psychiatric: He has a normal mood and affect.  Nursing note and vitals reviewed.    ED Treatments / Results  Labs (all labs ordered are listed, but only abnormal results are displayed) Labs Reviewed - No data to display  EKG  EKG Interpretation None        Radiology No results found.  Procedures Procedures (including critical care time)  Medications Ordered in ED Medications  albuterol (PROVENTIL HFA;VENTOLIN HFA) 108 (90 Base) MCG/ACT inhaler 2 puff (2 puffs Inhalation Given 10/11/17 1630)     Initial Impression / Assessment and Plan / ED Course  I have reviewed the triage vital signs and the nursing notes.  Pertinent labs & imaging results that were available during my care of the patient were reviewed by me and considered in my medical decision making (see chart for details).     Patient seen and examined. Medications ordered.   Vital signs reviewed and are as follows: BP 137/83 (BP Location: Right Arm)   Pulse 100   Temp 97.6 F (36.4 C) (Oral)   Resp 18   Ht 5\' 8"  (1.727 m)   Wt 90.7 kg (200 lb)   SpO2 100%   BMI 30.41 kg/m    5:07 PM Patient counseled on supportive care for viral URI and s/s to return including worsening symptoms, persistent fever, persistent vomiting, or if they have any other concerns. Urged to see PCP if symptoms persist for more than 3 days. Patient verbalizes understanding and agrees with plan.   5:07 PM Patient counseled on use of albuterol HFA. Instructed to use 1-2 puffs q 4 hours as needed for SOB.   Final Clinical Impressions(s) / ED Diagnoses   Final diagnoses:  Viral URI with cough   Patient with symptoms consistent with a viral syndrome.  Even history of asthma and reported wheezing, patient discharged home with an albuterol inhaler to use as needed for cough and wheezing.  Vitals are stable, no fever. No signs of dehydration. Lung exam normal, no signs of pneumonia. Supportive therapy indicated with return if symptoms worsen.       ED Discharge Orders        Ordered    benzonatate (TESSALON) 100 MG capsule  Every 8 hours     10/11/17 1620    naproxen (NAPROSYN) 500 MG tablet  2 times daily     10/11/17 1620       Renne CriglerGeiple, Orphia Mctigue, PA-C 10/11/17 1708    Rolland PorterJames, Mark,  MD 10/13/17 [phone]

## 2017-10-14 ENCOUNTER — Encounter (HOSPITAL_BASED_OUTPATIENT_CLINIC_OR_DEPARTMENT_OTHER): Payer: Self-pay | Admitting: Emergency Medicine

## 2017-10-14 ENCOUNTER — Emergency Department (HOSPITAL_BASED_OUTPATIENT_CLINIC_OR_DEPARTMENT_OTHER): Payer: Self-pay

## 2017-10-14 ENCOUNTER — Emergency Department (HOSPITAL_BASED_OUTPATIENT_CLINIC_OR_DEPARTMENT_OTHER)
Admission: EM | Admit: 2017-10-14 | Discharge: 2017-10-14 | Disposition: A | Discharge status: Home / Self Care | Payer: Self-pay | Attending: Emergency Medicine | Admitting: Emergency Medicine

## 2017-10-14 ENCOUNTER — Other Ambulatory Visit: Payer: Self-pay

## 2017-10-14 DIAGNOSIS — Y92017 Garden or yard in single-family (private) house as the place of occurrence of the external cause: Secondary | ICD-10-CM | POA: Insufficient documentation

## 2017-10-14 DIAGNOSIS — L0889 Other specified local infections of the skin and subcutaneous tissue: Secondary | ICD-10-CM | POA: Insufficient documentation

## 2017-10-14 DIAGNOSIS — L0291 Cutaneous abscess, unspecified: Secondary | ICD-10-CM

## 2017-10-14 DIAGNOSIS — L089 Local infection of the skin and subcutaneous tissue, unspecified: Secondary | ICD-10-CM

## 2017-10-14 DIAGNOSIS — X58XXXA Exposure to other specified factors, initial encounter: Secondary | ICD-10-CM | POA: Insufficient documentation

## 2017-10-14 DIAGNOSIS — Y999 Unspecified external cause status: Secondary | ICD-10-CM | POA: Insufficient documentation

## 2017-10-14 DIAGNOSIS — S60512A Abrasion of left hand, initial encounter: Secondary | ICD-10-CM | POA: Insufficient documentation

## 2017-10-14 DIAGNOSIS — Y9389 Activity, other specified: Secondary | ICD-10-CM | POA: Insufficient documentation

## 2017-10-14 DIAGNOSIS — L02512 Cutaneous abscess of left hand: Secondary | ICD-10-CM | POA: Insufficient documentation

## 2017-10-14 MED ORDER — HYDROCODONE-ACETAMINOPHEN 5-325 MG PO TABS: 1.0000 | ORAL_TABLET | Freq: Four times a day (QID) | ORAL | 0 refills | Status: DC | PRN

## 2017-10-14 MED ORDER — LIDOCAINE-EPINEPHRINE (PF) 2 %-1:200000 IJ SOLN
10.0000 mL | Freq: Once | INTRAMUSCULAR | Status: AC
  Administered 2017-10-14: 10 mL
  Filled 2017-10-14: qty 10

## 2017-10-14 MED ORDER — LIDOCAINE 4 % EX CREA
TOPICAL_CREAM | Freq: Once | CUTANEOUS | Status: AC
  Administered 2017-10-14: 1 via TOPICAL
  Filled 2017-10-14: qty 5

## 2017-10-14 MED ORDER — HYDROCODONE-ACETAMINOPHEN 5-325 MG PO TABS
1.0000 | ORAL_TABLET | Freq: Once | ORAL | Status: AC
  Administered 2017-10-14: 1 via ORAL
  Filled 2017-10-14: qty 1

## 2017-10-14 MED ORDER — SULFAMETHOXAZOLE-TRIMETHOPRIM 800-160 MG PO TABS: 1.0000 | ORAL_TABLET | Freq: Two times a day (BID) | ORAL | 0 refills | Status: AC

## 2017-10-14 MED ORDER — SULFAMETHOXAZOLE-TRIMETHOPRIM 800-160 MG PO TABS
1.0000 | ORAL_TABLET | Freq: Once | ORAL | Status: AC
  Administered 2017-10-14: 1 via ORAL
  Filled 2017-10-14: qty 1

## 2017-10-14 NOTE — ED Provider Notes (Signed)
[facility] EMERGENCY DEPARTMENT Provider Note   CSN: [id number] Arrival date & time: 10/14/17  1324     History   Chief Complaint Chief Complaint  Patient presents with  . Hand Injury    HPI Justin Arnold is a 31 y.o. male who presents today for evaluation of hand swelling.  Reports that he was outside cutting wood over the past few days and has had worsening pain and swelling to the dorsum of his left hand for 2-3 days.  He reports his last tetanus shot was within this year.  He denies fevers, chills, n/v.  He has tried ibuprofen with minimal relief of his pain.    HPI  Past Medical History:  Diagnosis Date  . Asthma   . Chronic back pain     There are no active problems to display for this patient.   Past Surgical History:  Procedure Laterality Date  . TONSILLECTOMY         Home Medications    Prior to Admission medications   Medication Sig Start Date End Date Taking? Authorizing Provider  benzonatate (TESSALON) 100 MG capsule Take 1 capsule (100 mg total) by mouth every 8 (eight) hours. 10/11/17   Renne CriglerGeiple, Joshua, PA-C  HYDROcodone-acetaminophen (NORCO/VICODIN) 5-325 MG tablet Take 1-2 tablets by mouth every 6 (six) hours as needed for severe pain. 10/14/17   Cristina GongHammond, Jaishawn Witzke W, PA-C  naproxen (NAPROSYN) 500 MG tablet Take 1 tablet (500 mg total) by mouth 2 (two) times daily. 10/11/17   Renne CriglerGeiple, Joshua, PA-C  sulfamethoxazole-trimethoprim (BACTRIM DS,SEPTRA DS) 800-160 MG tablet Take 1 tablet by mouth 2 (two) times daily for 7 days. 10/14/17 10/21/17  Cristina GongHammond, Audine Mangione W, PA-C    Family History History reviewed. No pertinent family history.  Social History Social History   Tobacco Use  . Smoking status: Current Every Day Smoker    Packs/day: 0.50    Types: Cigarettes  . Smokeless tobacco: Never Used  Substance Use Topics  . Alcohol use: No  . Drug use: No     Allergies   Patient has no known allergies.   Review of  Systems Review of Systems  Constitutional: Negative for chills and fever.  Musculoskeletal:       Hand pain and swelling  Skin:       Abrasions to bilateral hands  Neurological:       Baseline level of decreased sensation in bilateral fingers.    All other systems reviewed and are negative.    Physical Exam Updated Vital Signs BP (!) 152/79   Pulse 79   Temp 98.3 F (36.8 C) (Oral)   Resp 18   Ht 5\' 8"  (1.727 m)   Wt 90.7 kg (200 lb)   SpO2 100%   BMI 30.41 kg/m   Physical Exam  Constitutional: He appears well-developed and well-nourished. No distress.  HENT:  Head: Normocephalic and atraumatic.  Eyes: Conjunctivae are normal. Right eye exhibits no discharge. Left eye exhibits no discharge. No scleral icterus.  Neck: Normal range of motion.  Cardiovascular: Normal rate, regular rhythm and intact distal pulses.  Brisk cap refill to bilateral fingers.  Pulmonary/Chest: Effort normal. No stridor. No respiratory distress.  Abdominal: He exhibits no distension.  Musculoskeletal: He exhibits no edema or deformity.  There is swelling and TTP over dorsum of left hand.  There is a larger area of edema localized that is approx 1.5cmx1.5cm.    Neurological: He is alert. He exhibits normal muscle tone.  Decreased subjective  sensation to fingers on both hands.  He is able to tell he is being touched.  Limited ROM of finger flexion, secondary to pain.   Skin: Skin is warm and dry. He is not diaphoretic.  Multiple scabs and abrasions to left hand  Psychiatric: He has a normal mood and affect. His behavior is normal.  Nursing note and vitals reviewed.    ED Treatments / Results  Labs (all labs ordered are listed, but only abnormal results are displayed) Labs Reviewed - No data to display  EKG  EKG Interpretation None       Radiology Dg Hand Complete Left  Result Date: 10/14/2017 CLINICAL DATA:  Left hand pain with swelling for 2 days EXAM: LEFT HAND - COMPLETE 3+ VIEW  COMPARISON:  None. FINDINGS: There is no evidence of fracture or dislocation. 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 There is ulnar minus variance. Soft tissues are unremarkable. IMPRESSION: No acute osseous injury of the left hand. Electronically Signed   By: Elige KoHetal  Patel   On: 10/14/2017 13:49    Procedures .Marland Kitchen.Incision and Drainage Date/Time: 10/14/2017 4:30 PM Performed by: Cristina GongHammond, Kayon Dozier W, PA-C Authorized by: Cristina GongHammond, Corydon Schweiss W, PA-C   Consent:    Consent obtained:  Verbal   Consent given by:  Patient   Risks discussed:  Bleeding, infection and pain (retained foreign bodies, need for repeat procedure, damage to structures such as nerves, vessels, tendons, muscles, ligamants or other structures. )   Alternatives discussed:  No treatment, alternative treatment, referral and observation Location:    Type:  Abscess   Size:  1.5x1.5 cm   Location: Dorsum of left hand. Pre-procedure details:    Skin preparation:  Chloraprep Anesthesia (see MAR for exact dosages):    Anesthesia method:  Topical application and local infiltration   Topical anesthetic:  EMLA cream   Local anesthetic:  Lidocaine 2% WITH epi Procedure type:    Complexity:  Simple Procedure details:    Needle aspiration: yes     Needle size:  20 G   Incision types:  Stab incision   Incision depth:  Subcutaneous   Scalpel blade:  11   Wound management:  Probed and deloculated and irrigated with saline   Drainage:  Bloody (Mostly bloody with scant purulent material. )   Wound treatment:  Wound left open   Packing materials:  None Post-procedure details:    Patient tolerance of procedure:  Tolerated well, no immediate complications Comments:     Before and after the procedure the motor function,strength, sensation, and circulatory function was assessed.  After the procedure patient had intact motor function with 5/5 strength (equal to unaffected side) to all joints distal to the incision site with the  only sensory changes attributable to the local anesthetic.      Medications Ordered in ED Medications  lidocaine (LMX) 4 % cream (1 application Topical Given 10/14/17 1456)  lidocaine-EPINEPHrine (XYLOCAINE W/EPI) 2 %-1:200000 (PF) injection 10 mL (10 mLs Infiltration Given by Other 10/14/17 1456)  HYDROcodone-acetaminophen (NORCO/VICODIN) 5-325 MG per tablet 1 tablet (1 tablet Oral Given 10/14/17 1606)  sulfamethoxazole-trimethoprim (BACTRIM DS,SEPTRA DS) 800-160 MG per tablet 1 tablet (1 tablet Oral Given 10/14/17 1606)     Initial Impression / Assessment and Plan / ED Course  I have reviewed the triage vital signs and the nursing notes.  Pertinent labs & imaging results that were available during my care of the patient were reviewed by me and considered in my medical decision  making (see chart for details).    Patient with skin abscess amenable to incision and drainage.  Abscess was not large enough to warrant packing or drain,  wound recheck in 2 days. Encouraged home warm soaks and flushing.  Mild signs of cellulitis is surrounding skin.  Tdap UTD. Started on bactrim and short course of norco given after searching PMP.  Will d/c to home, strict return precautions given.      Final Clinical Impressions(s) / ED Diagnoses   Final diagnoses:  Infected abrasion of left hand, initial encounter  Abscess    ED Discharge Orders        Ordered    HYDROcodone-acetaminophen (NORCO/VICODIN) 5-325 MG tablet  Every 6 hours PRN     10/14/17 1603    sulfamethoxazole-trimethoprim (BACTRIM DS,SEPTRA DS) 800-160 MG tablet  2 times daily     10/14/17 1603       Cristina Gong, [place] 10/14/17 1635    Jacalyn Lefevre, MD 10/18/17 1534

## 2017-10-14 NOTE — Discharge Instructions (Signed)
Please take Ibuprofen (Advil, motrin) and Tylenol (acetaminophen) to relieve your pain.  You may take up to 600 MG (3 pills) of normal strength ibuprofen every 8 hours as needed.  In between doses of ibuprofen you make take tylenol, up to 1,000 mg (two extra strength pills).  Do not take more than 3,000 mg tylenol in a 24 hour period.  Please check all medication labels as many medications such as pain and cold medications may contain tylenol.  Do not drink alcohol while taking these medications.  Do not take other NSAID'S while taking ibuprofen (such as aleve or naproxen).  Please take ibuprofen with food to decrease stomach upset. ° °Today you received medications that may make you sleepy or impair your ability to make decisions.  For the next 24 hours please do not drive, operate heavy machinery, care for a small child with out another adult present, or perform any activities that may cause harm to you or someone else if you were to fall asleep or be impaired.  ° °You are being prescribed a medication which may make you sleepy. Please follow up of listed precautions for at least 24 hours after taking one dose. ° °You may have diarrhea from the antibiotics.  It is very important that you continue to take the antibiotics even if you get diarrhea unless a medical professional tells you that you may stop taking them.  If you stop too early the bacteria you are being treated for will become stronger and you may need different, more powerful antibiotics that have more side effects and worsening diarrhea.  Please stay well hydrated and consider probiotics as they may decrease the severity of your diarrhea.   °

## 2017-10-14 NOTE — ED Triage Notes (Signed)
Patient states that he started to have hand swelling and pain to his left and about 2 -3 days ago. He has multiple scares and scabs to his left hand  - cellulitis noted to the top of his hand

## 2018-01-25 ENCOUNTER — Emergency Department (HOSPITAL_BASED_OUTPATIENT_CLINIC_OR_DEPARTMENT_OTHER)
Admission: EM | Admit: 2018-01-25 | Discharge: 2018-01-25 | Disposition: A | Discharge status: Home / Self Care | Payer: Self-pay | Attending: Emergency Medicine | Admitting: Emergency Medicine

## 2018-01-25 ENCOUNTER — Other Ambulatory Visit: Payer: Self-pay

## 2018-01-25 ENCOUNTER — Encounter (HOSPITAL_BASED_OUTPATIENT_CLINIC_OR_DEPARTMENT_OTHER): Payer: Self-pay | Admitting: *Deleted

## 2018-01-25 ENCOUNTER — Emergency Department (HOSPITAL_BASED_OUTPATIENT_CLINIC_OR_DEPARTMENT_OTHER): Payer: Self-pay

## 2018-01-25 DIAGNOSIS — S61211A Laceration without foreign body of left index finger without damage to nail, initial encounter: Secondary | ICD-10-CM | POA: Insufficient documentation

## 2018-01-25 DIAGNOSIS — Y990 Civilian activity done for income or pay: Secondary | ICD-10-CM | POA: Insufficient documentation

## 2018-01-25 DIAGNOSIS — Y929 Unspecified place or not applicable: Secondary | ICD-10-CM | POA: Insufficient documentation

## 2018-01-25 DIAGNOSIS — F17210 Nicotine dependence, cigarettes, uncomplicated: Secondary | ICD-10-CM | POA: Insufficient documentation

## 2018-01-25 DIAGNOSIS — W268XXA Contact with other sharp object(s), not elsewhere classified, initial encounter: Secondary | ICD-10-CM | POA: Insufficient documentation

## 2018-01-25 DIAGNOSIS — J45909 Unspecified asthma, uncomplicated: Secondary | ICD-10-CM | POA: Insufficient documentation

## 2018-01-25 DIAGNOSIS — Z79899 Other long term (current) drug therapy: Secondary | ICD-10-CM | POA: Insufficient documentation

## 2018-01-25 DIAGNOSIS — Y9389 Activity, other specified: Secondary | ICD-10-CM | POA: Insufficient documentation

## 2018-01-25 MED ORDER — CEPHALEXIN 500 MG PO CAPS: 500.0000 mg | ORAL_CAPSULE | Freq: Four times a day (QID) | ORAL | 0 refills | Status: DC

## 2018-01-25 NOTE — ED Triage Notes (Signed)
Left index finger injury yesterday when he cut it on sheet metal. This am he re opened it. Red, swollen, painful.

## 2018-01-25 NOTE — ED Provider Notes (Signed)
[facility] EMERGENCY DEPARTMENT Provider Note   CSN: [id number] Arrival date & time: 01/25/18  1420     History   Chief Complaint Chief Complaint  Patient presents with  . Finger Injury    HPI Justin Arnold is a 32 y.o. male.  Patient presents the emergency department with acute onset of left index finger laceration sustained yesterday at approximately 3 PM.  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  His hands were dirty.  He cleaned wound as best as he could.  Patient presents today because the wound reopened this morning and bled.  Currently no bleeding.  Tetanus up-to-date.  No numbness or tingling.  Course is improving.     Past Medical History:  Diagnosis Date  . Asthma   . Chronic back pain     There are no active problems to display for this patient.   Past Surgical History:  Procedure Laterality Date  . TONSILLECTOMY          Home Medications    Prior to Admission medications   Medication Sig Start Date End Date Taking? Authorizing Provider  raNITIdine HCl (ZANTAC PO) Take by mouth.   Yes [provider]  benzonatate (TESSALON) 100 MG capsule Take 1 capsule (100 mg total) by mouth every 8 (eight) hours. 10/11/17   Renne Crigler, PA-C  cephALEXin (KEFLEX) 500 MG capsule Take 1 capsule (500 mg total) by mouth 4 (four) times daily. 01/25/18   Renne Crigler, PA-C  HYDROcodone-acetaminophen (NORCO/VICODIN) 5-325 MG tablet Take 1-2 tablets by mouth every 6 (six) hours as needed for severe pain. 10/14/17   Cristina Gong, PA-C  naproxen (NAPROSYN) 500 MG tablet Take 1 tablet (500 mg total) by mouth 2 (two) times daily. 10/11/17   Renne Crigler, PA-C    Family History No family history on file.  Social History Social History   Tobacco Use  . Smoking status: Current Every Day Smoker    Packs/day: 0.50    Types: Cigarettes  . Smokeless tobacco: Never Used  Substance Use Topics  . Alcohol use:  No  . Drug use: No     Allergies   Patient has no known allergies.   Review of Systems Review of Systems  Constitutional: Negative for activity change.  Musculoskeletal: Positive for myalgias. Negative for arthralgias, back pain, gait problem, joint swelling and neck pain.  Skin: Positive for wound.  Neurological: Negative for weakness and numbness.     Physical Exam Updated Vital Signs BP (!) 167/95   Pulse 86   Temp 98.3 F (36.8 C) (Oral)   Resp 20   Ht 5\' 9"  (1.753 m)   Wt 95.3 kg (210 lb)   SpO2 100%   BMI 31.01 kg/m   Physical Exam  Constitutional: He appears well-developed and well-nourished.  HENT:  Head: Normocephalic and atraumatic.  Eyes: Conjunctivae are normal.  Neck: Normal range of motion. Neck supple.  Cardiovascular: Normal pulses. Exam reveals no decreased pulses.  Musculoskeletal: He exhibits tenderness. He exhibits no edema.       Hands: Neurological: He is alert. No sensory deficit.  Motor, sensation, and vascular distal to the injury is fully intact.   Skin: Skin is warm and dry.  Psychiatric: He has a normal mood and affect.  Nursing note and vitals reviewed.    ED Treatments / Results  Labs (all labs ordered are listed, but only abnormal results are displayed) Labs Reviewed - No data to  display  EKG None  Radiology Dg Finger Index Left  Result Date: 01/25/2018 CLINICAL DATA:  Laceration injury yesterday. EXAM: LEFT INDEX FINGER 2+V COMPARISON:  10/14/2017 FINDINGS: Soft tissue injury along the lateral aspect of the midportion of the index finger is seen. No fracture. No air/gas in the joint. Tiny fleck like opacity in the region of the soft tissue injury. IMPRESSION: No bone or joint abnormality. Soft tissue injury. Tiny fleck like opacity at the soft tissue laceration. Electronically Signed   By: Paulina FusiMark  Shogry M.D.   On: 01/25/2018 15:07    Procedures Procedures (including critical care time)  Medications Ordered in  ED Medications - No data to display   Initial Impression / Assessment and Plan / ED Course  I have reviewed the triage vital signs and the nursing notes.  Pertinent labs & imaging results that were available during my care of the patient were reviewed by me and considered in my medical decision making (see chart for details).     Patient seen and examined.  X-ray shows small fleck of metal, do not suspect embedded foreign body.  Patient given Keflex prophylaxis.  Vital signs reviewed and are as follows: BP (!) 167/95   Pulse 86   Temp 98.3 F (36.8 C) (Oral)   Resp 20   Ht 5\' 9"  (1.753 m)   Wt 95.3 kg (210 lb)   SpO2 100%   BMI 31.01 kg/m   The finger appears neurovascularly intact.  The wound is lateral and shallow/oblique, flap-like laceration.  I have low concern for digital nerve injury as sensation intact distally.  Also given location, would be unlikely to injure the flexor mechanism.  I feel that patient's difficult flexing of the distal digit, likely at this point due to guarding.  Patient counseled on need to follow-up with PCP/Ortho referral if he continues to have difficulty with flexion in the next few days to week.  He verbalizes understanding and agrees with plan.  Final Clinical Impressions(s) / ED Diagnoses   Final diagnoses:  Laceration of left index finger without damage to nail, foreign body presence unspecified, initial encounter   Patient with laceration not requiring closure at this time.  Wound has been open for more than 24 hours.  I will concern for flexor tendon injury or significant nerve injury.  Patient to follow-up if symptoms do not improve.  ED Discharge Orders        Ordered    cephALEXin (KEFLEX) 500 MG capsule  4 times daily     01/25/18 1450       Renne CriglerGeiple, Lenay Lovejoy, PA-C 01/25/18 1540    Vanetta MuldersZackowski, Scott, MD 02/02/18 [phone]

## 2018-01-25 NOTE — Discharge Instructions (Signed)
Please read and follow all provided instructions.  Your diagnoses today include:  1. Laceration of left index finger without damage to nail, foreign body presence unspecified, initial encounter     Tests performed today include:  Vital signs. See below for your results today.   X-ray - no foreign bodies  Medications prescribed:   Keflex (cephalexin) - antibiotic  You have been prescribed an antibiotic medicine: take the entire course of medicine even if you are feeling better. Stopping early can cause the antibiotic not to work.  Take any prescribed medications only as directed.   Home care instructions:  Follow any educational materials and wound care instructions contained in this packet.   Keep affected area above the level of your heart when possible to minimize swelling. Wash area gently twice a day with warm soapy water. Do not apply alcohol or hydrogen peroxide. Cover the area if it draining or weeping.   Follow-up instructions: See the listed orthopedic doctor if you have any continued issues using her hand in the next 1 week.  Return instructions:  Return to the Emergency Department if you have:  Fever  Worsening pain  Worsening swelling of the wound  Pus draining from the wound  Redness of the skin that moves away from the wound, especially if it streaks away from the affected area   Any other emergent concerns  Your vital signs today were: BP (!) 167/95    Pulse 86    Temp 98.3 F (36.8 C) (Oral)    Resp 20    Ht 5\' 9"  (1.753 m)    Wt 95.3 kg (210 lb)    SpO2 100%    BMI 31.01 kg/m  If your blood pressure (BP) was elevated above 135/85 this visit, please have this repeated by your doctor within one month. --------------

## 2018-06-05 ENCOUNTER — Emergency Department (HOSPITAL_BASED_OUTPATIENT_CLINIC_OR_DEPARTMENT_OTHER)
Admission: EM | Admit: 2018-06-05 | Discharge: 2018-06-05 | Disposition: A | Discharge status: Home / Self Care | Payer: Self-pay | Attending: Emergency Medicine | Admitting: Emergency Medicine

## 2018-06-05 ENCOUNTER — Other Ambulatory Visit: Payer: Self-pay

## 2018-06-05 ENCOUNTER — Encounter (HOSPITAL_BASED_OUTPATIENT_CLINIC_OR_DEPARTMENT_OTHER): Payer: Self-pay

## 2018-06-05 DIAGNOSIS — M542 Cervicalgia: Secondary | ICD-10-CM | POA: Insufficient documentation

## 2018-06-05 DIAGNOSIS — R51 Headache: Secondary | ICD-10-CM | POA: Insufficient documentation

## 2018-06-05 DIAGNOSIS — F17210 Nicotine dependence, cigarettes, uncomplicated: Secondary | ICD-10-CM | POA: Insufficient documentation

## 2018-06-05 DIAGNOSIS — J45909 Unspecified asthma, uncomplicated: Secondary | ICD-10-CM | POA: Insufficient documentation

## 2018-06-05 DIAGNOSIS — Z79899 Other long term (current) drug therapy: Secondary | ICD-10-CM | POA: Insufficient documentation

## 2018-06-05 MED ORDER — CYCLOBENZAPRINE HCL 5 MG PO TABS: 5.0000 mg | ORAL_TABLET | Freq: Three times a day (TID) | ORAL | 0 refills | Status: AC | PRN

## 2018-06-05 MED ORDER — CYCLOBENZAPRINE HCL 5 MG PO TABS
5.0000 mg | ORAL_TABLET | Freq: Once | ORAL | Status: AC
  Administered 2018-06-05: 5 mg via ORAL
  Filled 2018-06-05: qty 1

## 2018-06-05 MED ORDER — IBUPROFEN 800 MG PO TABS
800.0000 mg | ORAL_TABLET | Freq: Once | ORAL | Status: AC
  Administered 2018-06-05: 800 mg via ORAL
  Filled 2018-06-05: qty 1
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
## 2018-06-05 NOTE — Discharge Instructions (Signed)
Your physical looks good today. You strained some of the muscles in your neck which is causing you that pain.   I have prescribed Flexeril which is a muscle relaxer and can be used if you feel like you are having a muscle spasm. Also, Tylenol and/or Ibuprofen can be used for additional pain relief along with a heating pad.

## 2018-07-01 ENCOUNTER — Encounter (HOSPITAL_BASED_OUTPATIENT_CLINIC_OR_DEPARTMENT_OTHER): Payer: Self-pay | Admitting: *Deleted

## 2018-07-01 ENCOUNTER — Emergency Department (HOSPITAL_BASED_OUTPATIENT_CLINIC_OR_DEPARTMENT_OTHER)
Admission: EM | Admit: 2018-07-01 | Discharge: 2018-07-01 | Disposition: A | Discharge status: Home / Self Care | Payer: Self-pay | Attending: Emergency Medicine | Admitting: Emergency Medicine

## 2018-07-01 ENCOUNTER — Other Ambulatory Visit: Payer: Self-pay

## 2018-07-01 DIAGNOSIS — S39012A Strain of muscle, fascia and tendon of lower back, initial encounter: Secondary | ICD-10-CM | POA: Insufficient documentation

## 2018-07-01 DIAGNOSIS — F17210 Nicotine dependence, cigarettes, uncomplicated: Secondary | ICD-10-CM | POA: Insufficient documentation

## 2018-07-01 DIAGNOSIS — X58XXXA Exposure to other specified factors, initial encounter: Secondary | ICD-10-CM | POA: Insufficient documentation

## 2018-07-01 DIAGNOSIS — J45909 Unspecified asthma, uncomplicated: Secondary | ICD-10-CM | POA: Insufficient documentation

## 2018-07-01 DIAGNOSIS — Y929 Unspecified place or not applicable: Secondary | ICD-10-CM | POA: Insufficient documentation

## 2018-07-01 DIAGNOSIS — Y939 Activity, unspecified: Secondary | ICD-10-CM | POA: Insufficient documentation

## 2018-07-01 DIAGNOSIS — Y999 Unspecified external cause status: Secondary | ICD-10-CM | POA: Insufficient documentation

## 2018-07-01 LAB — URINALYSIS, ROUTINE W REFLEX MICROSCOPIC
BILIRUBIN URINE: NEGATIVE
Glucose, UA: NEGATIVE mg/dL
HGB URINE DIPSTICK: NEGATIVE
Ketones, ur: NEGATIVE mg/dL
Leukocytes, UA: NEGATIVE
Nitrite: NEGATIVE
PROTEIN: NEGATIVE mg/dL
SPECIFIC GRAVITY, URINE: 1.025 (ref 1.005–1.030)
pH: 6 (ref 5.0–8.0)

## 2018-07-01 MED ORDER — CYCLOBENZAPRINE HCL 10 MG PO TABS: 10.0000 mg | ORAL_TABLET | Freq: Two times a day (BID) | ORAL | 0 refills | Status: DC | PRN

## 2018-07-01 MED ORDER — KETOROLAC TROMETHAMINE 30 MG/ML IJ SOLN
30.0000 mg | Freq: Once | INTRAMUSCULAR | Status: AC
  Administered 2018-07-01: 30 mg via INTRAMUSCULAR
  Filled 2018-07-01: qty 1

## 2018-07-01 MED ORDER — IBUPROFEN 600 MG PO TABS: 600.0000 mg | ORAL_TABLET | Freq: Four times a day (QID) | ORAL | 0 refills | Status: DC | PRN

## 2018-07-01 NOTE — ED Triage Notes (Signed)
Right flank pain x 3 days. States when is up doing work the pain gets worse.

## 2018-07-01 NOTE — ED Provider Notes (Signed)
[facility] EMERGENCY DEPARTMENT Provider Note   CSN: [id number] Arrival date & time: 07/01/18  1110     History   Chief Complaint Chief Complaint  Patient presents with  . Flank Pain    HPI Justin Arnold is a 32 y.o. male.  Pt presents to the ED today with right sided low back pain.  The pt said sx have been going on for 3 days.  Pain is worse with movement.  No known injury.  No hx kidney stones.  No numbness in legs.     Past Medical History:  Diagnosis Date  . Asthma   . Chronic back pain     There are no active problems to display for this patient.   Past Surgical History:  Procedure Laterality Date  . TONSILLECTOMY          Home Medications    Prior to Admission medications   Medication Sig Start Date End Date Taking? Authorizing Provider  benzonatate (TESSALON) 100 MG capsule Take 1 capsule (100 mg total) by mouth every 8 (eight) hours. 10/11/17   Renne Crigler, PA-C  cephALEXin (KEFLEX) 500 MG capsule Take 1 capsule (500 mg total) by mouth 4 (four) times daily. 01/25/18   Renne Crigler, PA-C  cyclobenzaprine (FLEXERIL) 10 MG tablet Take 1 tablet (10 mg total) by mouth 2 (two) times daily as needed for muscle spasms. 07/01/18   Jacalyn Lefevre, MD  HYDROcodone-acetaminophen (NORCO/VICODIN) 5-325 MG tablet Take 1-2 tablets by mouth every 6 (six) hours as needed for severe pain. 10/14/17   Cristina Gong, PA-C  ibuprofen (ADVIL,MOTRIN) 600 MG tablet Take 1 tablet (600 mg total) by mouth every 6 (six) hours as needed. 07/01/18   Jacalyn Lefevre, MD  naproxen (NAPROSYN) 500 MG tablet Take 1 tablet (500 mg total) by mouth 2 (two) times daily. 10/11/17   Renne Crigler, PA-C  raNITIdine HCl (ZANTAC PO) Take by mouth.    [provider]    Family History No family history on file.  Social History Social History   Tobacco Use  . Smoking status: Current Every Day Smoker    Packs/day: 0.50    Types: Cigarettes  . Smokeless  tobacco: Never Used  Substance Use Topics  . Alcohol use: No  . Drug use: No     Allergies   Patient has no known allergies.   Review of Systems Review of Systems  Musculoskeletal: Positive for back pain.  All other systems reviewed and are negative.    Physical Exam Updated Vital Signs BP (!) 147/77   Pulse (!) 109   Temp 98.2 F (36.8 C) (Oral)   Resp 18   Ht 5\' 9"  (1.753 m)   Wt 98.2 kg   SpO2 100%   BMI 31.97 kg/m   Physical Exam  Constitutional: He is oriented to person, place, and time. He appears well-developed and well-nourished.  HENT:  Head: Normocephalic and atraumatic.  Right Ear: External ear normal.  Left Ear: External ear normal.  Nose: Nose normal.  Mouth/Throat: Oropharynx is clear and moist.  Eyes: Pupils are equal, round, and reactive to light. Conjunctivae and EOM are normal.  Neck: Normal range of motion. Neck supple.  Cardiovascular: Normal rate, regular rhythm, normal heart sounds and intact distal pulses.  Pulmonary/Chest: Effort normal and breath sounds normal.  Abdominal: Soft. Bowel sounds are normal.  Musculoskeletal:       Arms: Neurological: He is alert and oriented to person, place, and time.  Skin: Skin  is warm. Capillary refill takes less than 2 seconds.  Psychiatric: He has a normal mood and affect. His behavior is normal. Judgment and thought content normal.  Nursing note and vitals reviewed.    ED Treatments / Results  Labs (all labs ordered are listed, but only abnormal results are displayed) Labs Reviewed  URINALYSIS, ROUTINE W REFLEX MICROSCOPIC    EKG None  Radiology No results found.  Procedures Procedures (including critical care time)  Medications Ordered in ED Medications  ketorolac (TORADOL) 30 MG/ML injection 30 mg (has no administration in time range)     Initial Impression / Assessment and Plan / ED Course  I have reviewed the triage vital signs and the nursing notes.  Pertinent labs &  imaging results that were available during my care of the patient were reviewed by me and considered in my medical decision making (see chart for details).    Pt's UA is negative.  I don't think pt has a kidney stone.  He is tender to palpation.  He is given a dose of toradol in ED.  He will be d/c home with ibuprofen and flexeril.  Return if worse.  Final Clinical Impressions(s) / ED Diagnoses   Final diagnoses:  Strain of lumbar region, initial encounter    ED Discharge Orders         Ordered    ibuprofen (ADVIL,MOTRIN) 600 MG tablet  Every 6 hours PRN     07/01/18 1146    cyclobenzaprine (FLEXERIL) 10 MG tablet  2 times daily PRN     07/01/18 1146           Jacalyn Lefevre, MD 07/01/18 1149

## 2019-01-14 ENCOUNTER — Telehealth: Payer: Self-pay | Admitting: Pharmacy Technician

## 2019-01-14 NOTE — Telephone Encounter (Signed)
[facility] Patient Product/process development scientist completed.  Tested [place] Medicaid and it is not active for medications.  The patient is uninsured and will need patient assistance for medication.  We can complete the application and will need to meet with the patient for signatures and income documentation.  Netty Starring. Dimas Aguas CPhT Specialty Pharmacy Patient [facility] for Infectious Disease Phone: [phone] Fax:  [phone]

## 2019-01-15 ENCOUNTER — Ambulatory Visit: Payer: Self-pay | Admitting: Pharmacist

## 2019-01-15 ENCOUNTER — Ambulatory Visit (INDEPENDENT_AMBULATORY_CARE_PROVIDER_SITE_OTHER): Payer: Medicaid Other | Admitting: Pharmacist

## 2019-01-15 ENCOUNTER — Other Ambulatory Visit (HOSPITAL_COMMUNITY)
Admission: RE | Admit: 2019-01-15 | Discharge: 2019-01-15 | Disposition: A | Discharge status: Home / Self Care | Payer: Medicaid Other | Source: Ambulatory Visit | Attending: Infectious Disease | Admitting: Infectious Disease

## 2019-01-15 ENCOUNTER — Other Ambulatory Visit: Payer: Self-pay

## 2019-01-15 DIAGNOSIS — Z7252 High risk homosexual behavior: Secondary | ICD-10-CM | POA: Diagnosis present

## 2019-01-15 DIAGNOSIS — B182 Chronic viral hepatitis C: Secondary | ICD-10-CM

## 2019-01-15 NOTE — Progress Notes (Signed)
duplicate

## 2019-01-15 NOTE — Progress Notes (Signed)
Date:  01/15/2019   HPI: Justin Arnold is a 33 y.o. male who presents to the [facility] pharmacy clinic to discuss and initiate PrEP.  Insured   []    Uninsured  [x]    There are no active problems to display for this patient.   Patient's Medications  New Prescriptions   No medications on file  Previous Medications   No medications on file  Modified Medications   No medications on file  Discontinued Medications   BENZONATATE (TESSALON) 100 MG CAPSULE    Take 1 capsule (100 mg total) by mouth every 8 (eight) hours.   CEPHALEXIN (KEFLEX) 500 MG CAPSULE    Take 1 capsule (500 mg total) by mouth 4 (four) times daily.   CYCLOBENZAPRINE (FLEXERIL) 10 MG TABLET    Take 1 tablet (10 mg total) by mouth 2 (two) times daily as needed for muscle spasms.   HYDROCODONE-ACETAMINOPHEN (NORCO/VICODIN) 5-325 MG TABLET    Take 1-2 tablets by mouth every 6 (six) hours as needed for severe pain.   IBUPROFEN (ADVIL,MOTRIN) 600 MG TABLET    Take 1 tablet (600 mg total) by mouth every 6 (six) hours as needed.   NAPROXEN (NAPROSYN) 500 MG TABLET    Take 1 tablet (500 mg total) by mouth 2 (two) times daily.   RANITIDINE HCL (ZANTAC PO)    Take by mouth.    Allergies: No Known Allergies  Past Medical History: Past Medical History:  Diagnosis Date  . Asthma   . Chronic back pain     Social History: Social History   Socioeconomic History  . Marital status: Single    Spouse name: Not on file  . Number of children: Not on file  . Years of education: Not on file  . Highest education level: Not on file  Occupational History  . Not on file  Social Needs  . Financial resource strain: Not on file  . Food insecurity:    Worry: Not on file    Inability: Not on file  . Transportation needs:    Medical: Not on file    Non-medical: Not on file  Tobacco Use  . Smoking status: Current Every Day Smoker    Packs/day: 0.50    Types: Cigarettes  . Smokeless tobacco: Never Used  Substance and Sexual  Activity  . Alcohol use: No  . Drug use: No  . Sexual activity: Not on file  Lifestyle  . Physical activity:    Days per week: Not on file    Minutes per session: Not on file  . Stress: Not on file  Relationships  . Social connections:    Talks on phone: Not on file    Gets together: Not on file    Attends religious service: Not on file    Active member of club or organization: Not on file    Attends meetings of clubs or organizations: Not on file    Relationship status: Not on file  Other Topics Concern  . Not on file  Social History Narrative  . Not on file    CHL HIV PREP FLOWSHEET RESULTS 01/15/2019  Insurance Status Uninsured  Gender at birth Male  Gender identity cis-Male  Risk for HIV Condomless vaginal or anal intercourse;In sexual relationship with HIV+ partner;Injection drug use;Stimulant drug use in MSM  Sex Partners Men only  # sex partners past 3-6 mos 1-3  Sex activity preferences Insertive and receptive;Oral  Condom use No  Treated for STI? No  HIV symptoms? N/A  PrEP Eligibility Substantial risk for HIV    Labs:  SCr: No results found for: CREATININE HIV No results found for: HIV Hepatitis B No results found for: HEPBSAB, HEPBSAG, HEPBCAB Hepatitis C No results found for: HEPCAB, HCVRNAPCRQN Hepatitis A No results found for: HAV RPR and STI No results found for: LABRPR, RPRTITER  No flowsheet data found.  Assessment: Justin Arnold is here today to discuss and initiate PrEP.  He is here with his partner who is HIV positive and currently seeing Dr. Luciana Axe. He knows little about PrEP at baseline, so I spent a good amount of time discussing what PrEP is.  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  He states that he is monogamous with his HIV positive partner and has been for about a year and a half now. He is versatile engaging in both receptive and insertive intercourse  with oral involvement as well.  They do not use condoms. He states that he thinks his partner is on medications and is undetectable, but patient has spotty adherence and his last HIV viral load was 91,600 in July 2019.  I explained the U=U campaign with the CDC and encouraged him to encourage his partner to take his ART every day. He also has a history of IVDU and cocaine use.  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  He was last tested for STDs and HIV in 2016 when he was in prison. Based on his history, he definitely qualifies for PrEP and is interested in getting started.  Counseled that Descovy is a one pill once daily regimen with or without food that can prevent HIV. Discussed the importance of taking the medication daily to provide protection and decreased adherence is associated with decreased efficacy. Also discussed how Descovy works to prevent HIV but not other STDs and encouraged the use of condoms. Counseled on what to do if dose is missed, if closer to missed dose take immediately, if closer to next dose then skip and resume normal schedule.  Counseled that Descovy is normally well tolerated, however some patients experience a "start up syndrome" with nausea, diarrhea, dizziness, and fatigue but that those should resolve soon after starting.  Advised that any nausea can be mitigated by taking it with food. I reviewed patient medications and found no drug interactions. Discussed how our PrEP process works here at the clinic including follow ups and lab monitoring every 3 months.  When discussing other medications he may be on (he is on none at home) he did state that he is also Hepatitis C positive and is needing to find somewhere to help him with that.  He states he tested positive when he was in prison.  I told him that I would get a few confirmatory labs today to see if he still had active Hepatitis C and would get him in with one  of our nurse practitioners to start treatment for that if he has active Hepatitis C.   Will fill out the [employer] patient assistance form to get him Descovy since he is uninsured.  Will also have him fill out the [facility] financial assistance form to cover the labs for today's visit.  He will need to fill out [facility] financial assistance if he is positive in order to see our NPs. Will check all  baseline labs today and call him tomorrow with results.  Will start Descovy and see him back in 3 months if he is HIV negative.  He gave the "ok to mail" to his home from [facility].  Plan: - HIV antibody, BMET, RPR, urine/oral/rectal gonorrhea/chlamydia cytology, hepatitis B surface antigen, hepatitis B surface antibody, hepatitis A antibody today - Hepatitis C antibody, Hepatitis C RNA, CMET, CBC today as well - Send application to [employer] out [employer] assistance form - Descovy x 3 months if HIV negative - F/u with me 6/22 at 1130am for PrEP and will schedule with NP if needed for Hepatitis C   L. , PharmD, BCIDP, AAHIVP, CPP Infectious Diseases Clinical Pharmacist [facility] 01/15/2019, 2:59 PM

## 2019-01-16 ENCOUNTER — Telehealth: Payer: Self-pay | Admitting: Pharmacy Technician

## 2019-01-16 ENCOUNTER — Telehealth: Payer: Self-pay | Admitting: Pharmacist

## 2019-01-16 DIAGNOSIS — Z7252 High risk homosexual behavior: Secondary | ICD-10-CM

## 2019-01-16 DIAGNOSIS — B182 Chronic viral hepatitis C: Secondary | ICD-10-CM

## 2019-01-16 LAB — HIV ANTIBODY (ROUTINE TESTING W REFLEX): HIV: NONREACTIVE

## 2019-01-16 LAB — HEPATITIS B SURFACE ANTIGEN: Hepatitis B Surface Ag: NONREACTIVE

## 2019-01-16 LAB — CYTOLOGY, (ORAL, ANAL, URETHRAL) ANCILLARY ONLY
Chlamydia: NEGATIVE
Chlamydia: NEGATIVE
Neisseria Gonorrhea: NEGATIVE
Neisseria Gonorrhea: NEGATIVE

## 2019-01-16 LAB — URINE CYTOLOGY ANCILLARY ONLY
Chlamydia: NEGATIVE
NEISSERIA GONORRHEA: NEGATIVE

## 2019-01-16 LAB — RPR: RPR: NONREACTIVE

## 2019-01-16 LAB — HEPATITIS A ANTIBODY, TOTAL: Hepatitis A AB,Total: NONREACTIVE

## 2019-01-16 LAB — HEPATITIS B SURFACE ANTIBODY,QUALITATIVE: HEP B S AB: BORDERLINE — AB

## 2019-01-16 MED ORDER — EMTRICITABINE-TENOFOVIR AF 200-25 MG PO TABS: 1.0000 | ORAL_TABLET | Freq: Every day | ORAL | 2 refills | Status: DC

## 2019-01-16 MED FILL — DESCOVY 200-25 MG TABS: 200-25 | 30 days supply | Qty: 30 | Fill #0

## 2019-01-16 NOTE — Telephone Encounter (Addendum)
[facility] Patient Advocate Encounter  Completed and sent [employer] Advancing Access application for Descovy for this patient who is uninsured.   He is approved for 01/16/2019 to 01/16/2020  BIN      [id number] PCN    [id number] GRP    [id number] ID     [id number]  Kathie Rhodes E. Dimas Aguas CPhT Specialty Pharmacy Patient [facility] for Infectious Disease Phone: [phone] Fax:  [phone]

## 2019-01-16 NOTE — Telephone Encounter (Signed)
Called patient to let him know that his HIV antibody was negative - left HIPAA compliant voicemail.  Will send in 3 months of Descovy and [facility] will mail it to his house. Will call him when his other lab results have returned.

## 2019-01-24 LAB — CBC
HCT: 44.3 % (ref 38.5–50.0)
HEMOGLOBIN: 15.1 g/dL (ref 13.2–17.1)
MCH: 30 pg (ref 27.0–33.0)
MCHC: 34.1 g/dL (ref 32.0–36.0)
MCV: 87.9 fL (ref 80.0–100.0)
MPV: 10.1 fL (ref 7.5–12.5)
PLATELETS: 357 10*3/uL (ref 140–400)
RBC: 5.04 10*6/uL (ref 4.20–5.80)
RDW: 13.1 % (ref 11.0–15.0)
WBC: 8.3 10*3/uL (ref 3.8–10.8)

## 2019-01-24 LAB — COMPREHENSIVE METABOLIC PANEL
AG Ratio: 1.7 (calc) (ref 1.0–2.5)
ALBUMIN MSPROF: 4 g/dL (ref 3.6–5.1)
ALT: 24 U/L (ref 9–46)
AST: 24 U/L (ref 10–40)
Alkaline phosphatase (APISO): 64 U/L (ref 36–130)
BUN: 14 mg/dL (ref 7–25)
CO2: 26 mmol/L (ref 20–32)
CREATININE: 1.04 mg/dL (ref 0.60–1.35)
Calcium: 9.7 mg/dL (ref 8.6–10.3)
Chloride: 105 mmol/L (ref 98–110)
GLUCOSE: 96 mg/dL (ref 65–99)
Globulin: 2.4 g/dL (calc) (ref 1.9–3.7)
Potassium: 4.6 mmol/L (ref 3.5–5.3)
Sodium: 141 mmol/L (ref 135–146)
TOTAL PROTEIN: 6.4 g/dL (ref 6.1–8.1)
Total Bilirubin: 0.2 mg/dL (ref 0.2–1.2)

## 2019-01-24 LAB — HEPATITIS C RNA QUANTITATIVE
HCV QUANT LOG: NOT DETECTED {Log_IU}/mL
HCV RNA, PCR, QN: <15 IU/mL

## 2019-01-24 LAB — HEPATITIS C ANTIBODY
Hepatitis C Ab: REACTIVE — AB
SIGNAL TO CUT-OFF: 27.5 — ABNORMAL HIGH (ref <1.00)

## 2019-02-13 MED FILL — DESCOVY 200-25 MG TABS: 200-25 | 30 days supply | Qty: 30 | Fill #1

## 2019-03-10 MED FILL — DESCOVY 200-25 MG TABS: 200-25 | 30 days supply | Qty: 30 | Fill #2

## 2019-04-14 ENCOUNTER — Ambulatory Visit: Payer: Medicaid Other | Admitting: Pharmacist

## 2019-06-27 ENCOUNTER — Telehealth: Payer: Self-pay | Admitting: Pharmacy Technician

## 2019-06-27 NOTE — Telephone Encounter (Signed)
[facility] Patient Advocate Encounter   Patient called this morning and left a voicemail requesting an appointment to reestablish care from his missed appointment in June. Returned his call but went to voicemail. Left a HIPAA compliant voicemail to get him setup with an opening appointment.

## 2019-08-11 ENCOUNTER — Other Ambulatory Visit: Payer: Self-pay

## 2019-08-11 ENCOUNTER — Ambulatory Visit (INDEPENDENT_AMBULATORY_CARE_PROVIDER_SITE_OTHER): Payer: Self-pay | Admitting: Pharmacist

## 2019-08-11 DIAGNOSIS — Z23 Encounter for immunization: Secondary | ICD-10-CM

## 2019-08-11 DIAGNOSIS — Z7252 High risk homosexual behavior: Secondary | ICD-10-CM

## 2019-08-11 NOTE — Progress Notes (Signed)
Date:  08/11/2019   HPI: Justin Arnold is a 33 y.o. male who presents to the [facility] pharmacy clinic for 3 month PrEP follow-up.  Insured   []    Uninsured  [x]    There are no active problems to display for this patient.   Patient's Medications  New Prescriptions   No medications on file  Previous Medications   EMTRICITABINE-TENOFOVIR AF (DESCOVY) 200-25 MG TABLET    Take 1 tablet by mouth daily.  Modified Medications   No medications on file  Discontinued Medications   No medications on file    Allergies: No Known Allergies  Past Medical History: Past Medical History:  Diagnosis Date  . Asthma   . Chronic back pain     Social History: Social History   Socioeconomic History  . Marital status: Single    Spouse name: Not on file  . Number of children: Not on file  . Years of education: Not on file  . Highest education level: Not on file  Occupational History  . Not on file  Social Needs  . Financial resource strain: Not on file  . Food insecurity    Worry: Not on file    Inability: Not on file  . Transportation needs    Medical: Not on file    Non-medical: Not on file  Tobacco Use  . Smoking status: Current Every Day Smoker    Packs/day: 0.50    Types: Cigarettes  . Smokeless tobacco: Never Used  Substance and Sexual Activity  . Alcohol use: No  . Drug use: No  . Sexual activity: Not on file  Lifestyle  . Physical activity    Days per week: Not on file    Minutes per session: Not on file  . Stress: Not on file  Relationships  . Social on phone: Not on file    Gets together: Not on file    Attends religious service: Not on file    Active member of club or organization: Not on file    Attends meetings of clubs or organizations: Not on file    Relationship status: Not on file  Other Topics Concern  . Not on file  Social History Narrative  . Not on file    CHL HIV PREP FLOWSHEET RESULTS 08/11/2019 01/15/2019   Insurance Status Uninsured Uninsured  Gender at birth Male Male  Gender identity cis-Male cis-Male  Risk for HIV In sexual relationship with HIV+ partner;Condomless vaginal or anal intercourse;Stimulant drug use in MSM;Injection drug use Condomless vaginal or anal intercourse;In sexual relationship with HIV+ partner;Injection drug use;Stimulant drug use in MSM  Sex Partners Men only Men only  # sex partners past 3-6 mos 1-3 1-3  Sex activity preferences Insertive and receptive;Oral Insertive and receptive;Oral  Condom use No No  Treated for STI? No No  HIV symptoms? - N/A  PrEP Eligibility Substantial risk for HIV Substantial risk for HIV  Paper work received? Yes -    Labs:  SCr: Lab Results  Component Value Date   CREATININE 1.04 01/15/2019   HIV Lab Results  Component Value Date   HIV NON-REACTIVE 01/15/2019   Hepatitis B Lab Results  Component Value Date   HEPBSAB BORDERLINE (A) 01/15/2019   HEPBSAG NON-REACTIVE 01/15/2019   Hepatitis C Lab Results  Component Value Date   HEPCAB REACTIVE (A) 01/15/2019   HCVRNAPCRQN <15 NOT DETECTED 01/15/2019   Hepatitis A Lab Results  Component Value Date  HAV NON-REACTIVE 01/15/2019   RPR and STI Lab Results  Component Value Date   LABRPR NON-REACTIVE 01/15/2019    STI Results GC CT  01/15/2019 Negative Negative  01/15/2019 Negative Negative  01/15/2019 Negative Negative    Assessment: Justin Arnold came in this afternoon for his 3 month follow-up PrEP-Descovy appointment. He states that is he is doing "so-so" today. He has no complaints of adverse effects or issues with getting his medication. He expressed concern about his previous HCV lab results, and we discussed them thoroughly. We confirmed that his labs showed no active HCV infection, but that he had previously been infected. Today we will recheck is HCV viral load to ensure it is resolved.   Justin Arnold claims he only has one partner. He has not had any signs or symptoms of  STDs or HIV recently. His partner Justin Arnold is HIV positive, but his most recent viral load was undetectable. Justin Arnold is getting labs today, and will return for an appointment with Dr. Linus Salmons in November.   He is almost out of his Descovy. We will check an HIV antibody and get a BMP to assess his renal function today. If HIV negative and his renal function is stable, we will send in three months of Descovy. Justin Arnold also requested a flu shot today and received it with no issues.   Plan: - Ordered BMP and HIV antibody - If HIV negative, will send in three more months of Descovy - Will follow up with Justin Arnold in three months on 11/03/2019  Agnes Lawrence, PharmD PGY1 Pharmacy Resident [place] for Infectious Disease 08/11/2019, 4:12 PM

## 2019-08-13 LAB — RPR: RPR Ser Ql: NONREACTIVE

## 2019-08-13 LAB — BASIC METABOLIC PANEL
BUN: 15 mg/dL (ref 7–25)
CO2: 24 mmol/L (ref 20–32)
Calcium: 9.8 mg/dL (ref 8.6–10.3)
Chloride: 102 mmol/L (ref 98–110)
Creat: 0.93 mg/dL (ref 0.60–1.35)
Glucose, Bld: 83 mg/dL (ref 65–99)
Potassium: 4.4 mmol/L (ref 3.5–5.3)
Sodium: 140 mmol/L (ref 135–146)

## 2019-08-13 LAB — HEPATITIS C RNA QUANTITATIVE
HCV Quantitative Log: <1.18 Log IU/mL — AB
HCV RNA, PCR, QN: <15 IU/mL — AB

## 2019-08-13 LAB — HIV ANTIBODY (ROUTINE TESTING W REFLEX): HIV 1&2 Ab, 4th Generation: NONREACTIVE

## 2019-08-14 ENCOUNTER — Telehealth: Payer: Self-pay | Admitting: Pharmacist

## 2019-08-14 DIAGNOSIS — Z7252 High risk homosexual behavior: Secondary | ICD-10-CM

## 2019-08-14 LAB — URINE CYTOLOGY ANCILLARY ONLY
Chlamydia: NEGATIVE
Comment: NEGATIVE
Comment: NORMAL
Neisseria Gonorrhea: NEGATIVE

## 2019-08-14 MED ORDER — DESCOVY 200-25 MG PO TABS: 1.0000 | ORAL_TABLET | Freq: Every day | ORAL | 2 refills | Status: DC

## 2019-08-14 MED FILL — DESCOVY 200-25 MG TABS: 200-25 | 30 days supply | Qty: 30 | Fill #0

## 2019-08-14 NOTE — Telephone Encounter (Signed)
Called patient to discuss results, no answer. Left HIPAA compliant VM. Will mail his Descovy to his house (he verified his address at office visit).

## 2019-09-15 NOTE — Telephone Encounter (Signed)
[facility] Patient Advocate Encounter  [place] [employer] faxed over a reimbursement solutions for patients in need form. Filled out the form indicating that the patient was still in care and needed coverage. This was faxed back to [place] at [phone].  Bartholomew Crews, CPhT Specialty Pharmacy Patient [facility] for Infectious Disease Phone: [phone] Fax: [phone] 09/15/2019 12:05 PM

## 2019-09-30 IMAGING — CR DG FINGER INDEX 2+V*L*
3 series · 3 of 3 positions shown · non-contrast
Comparison: 10/14/2017

CLINICAL DATA: Laceration injury yesterday.

EXAM:
LEFT INDEX FINGER 2+V

[x finger pa left]
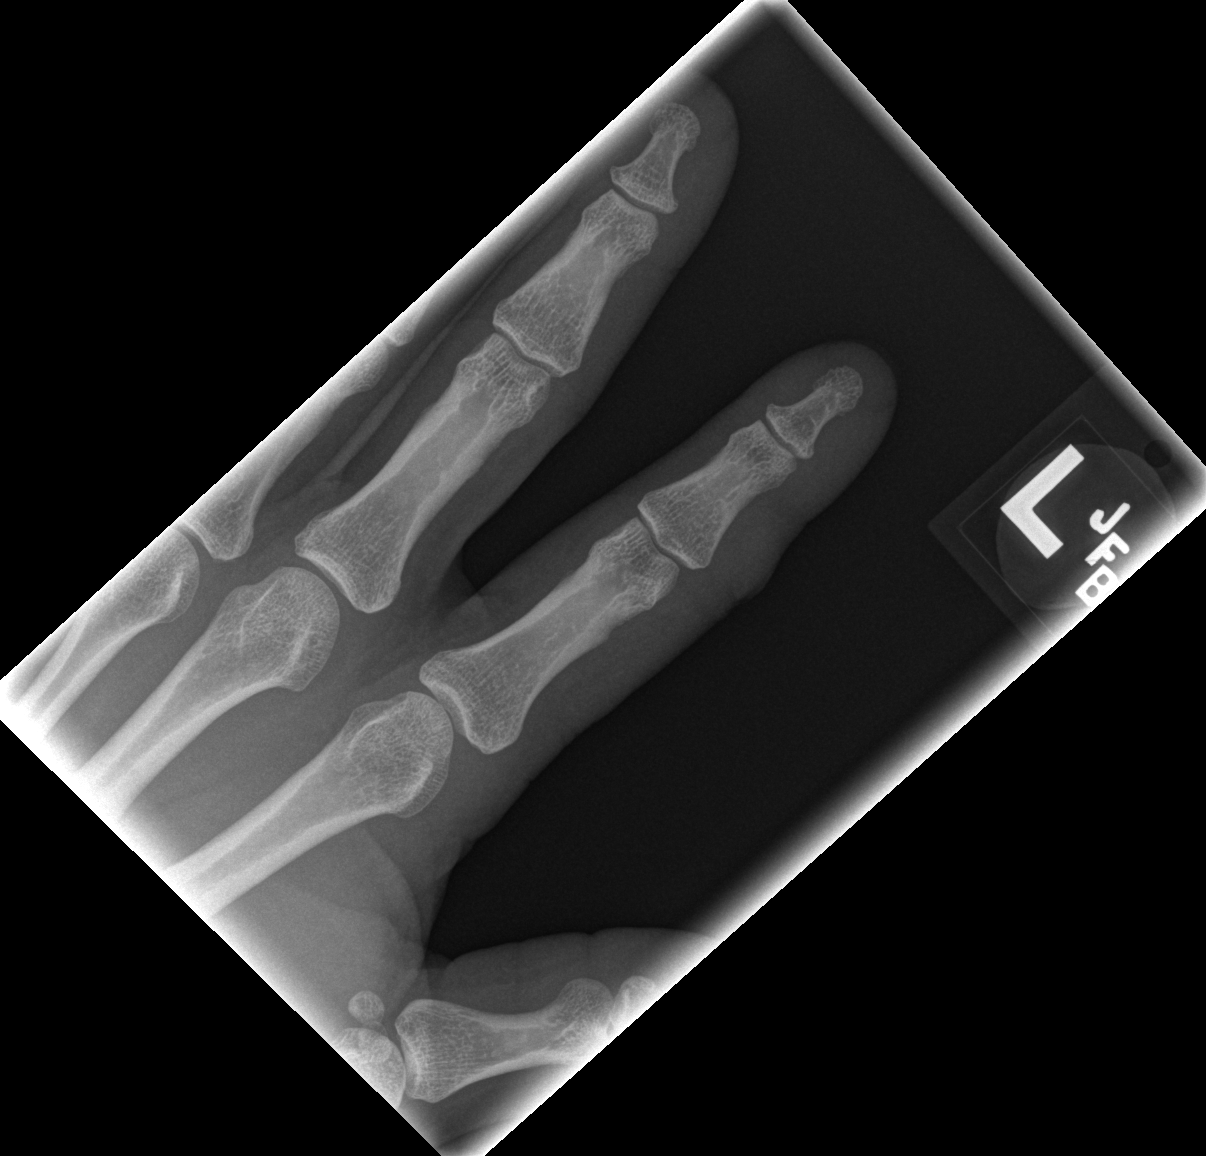

[x finger obl. left]
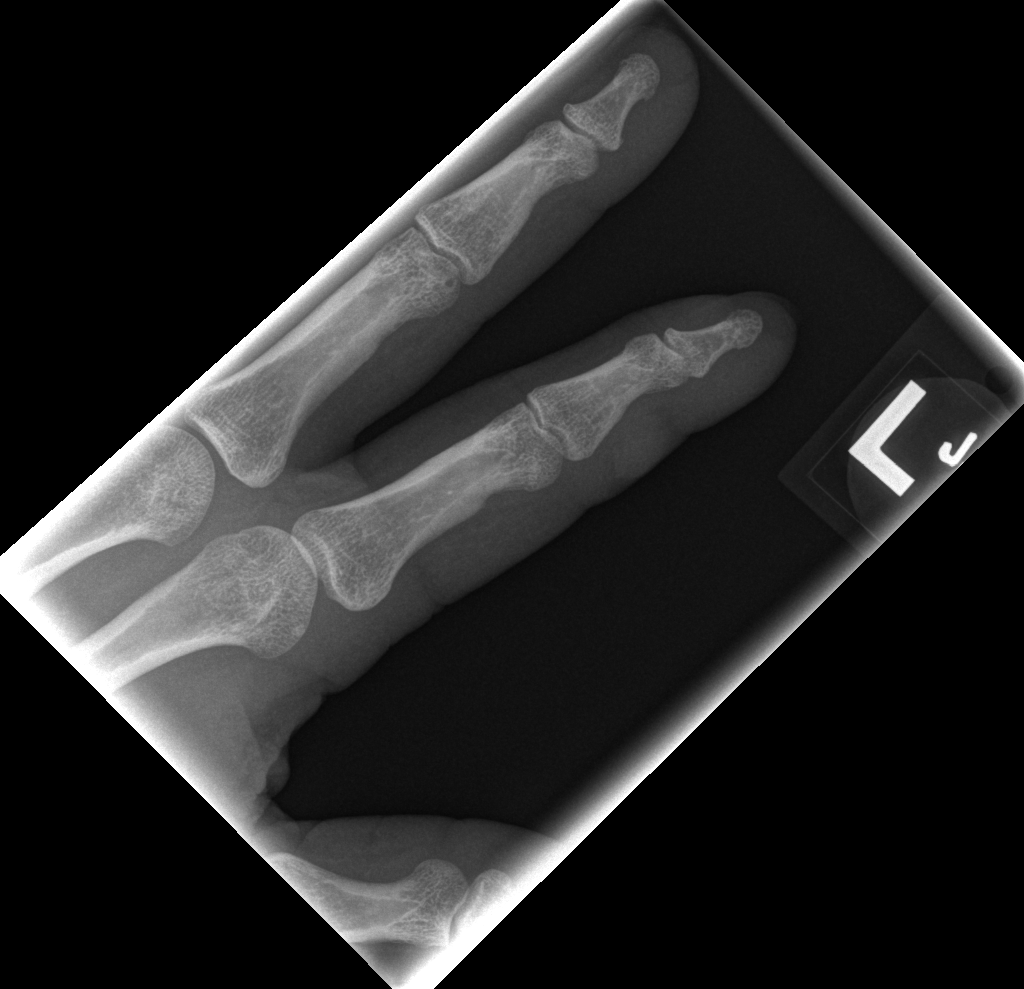

[x finger lateral left]
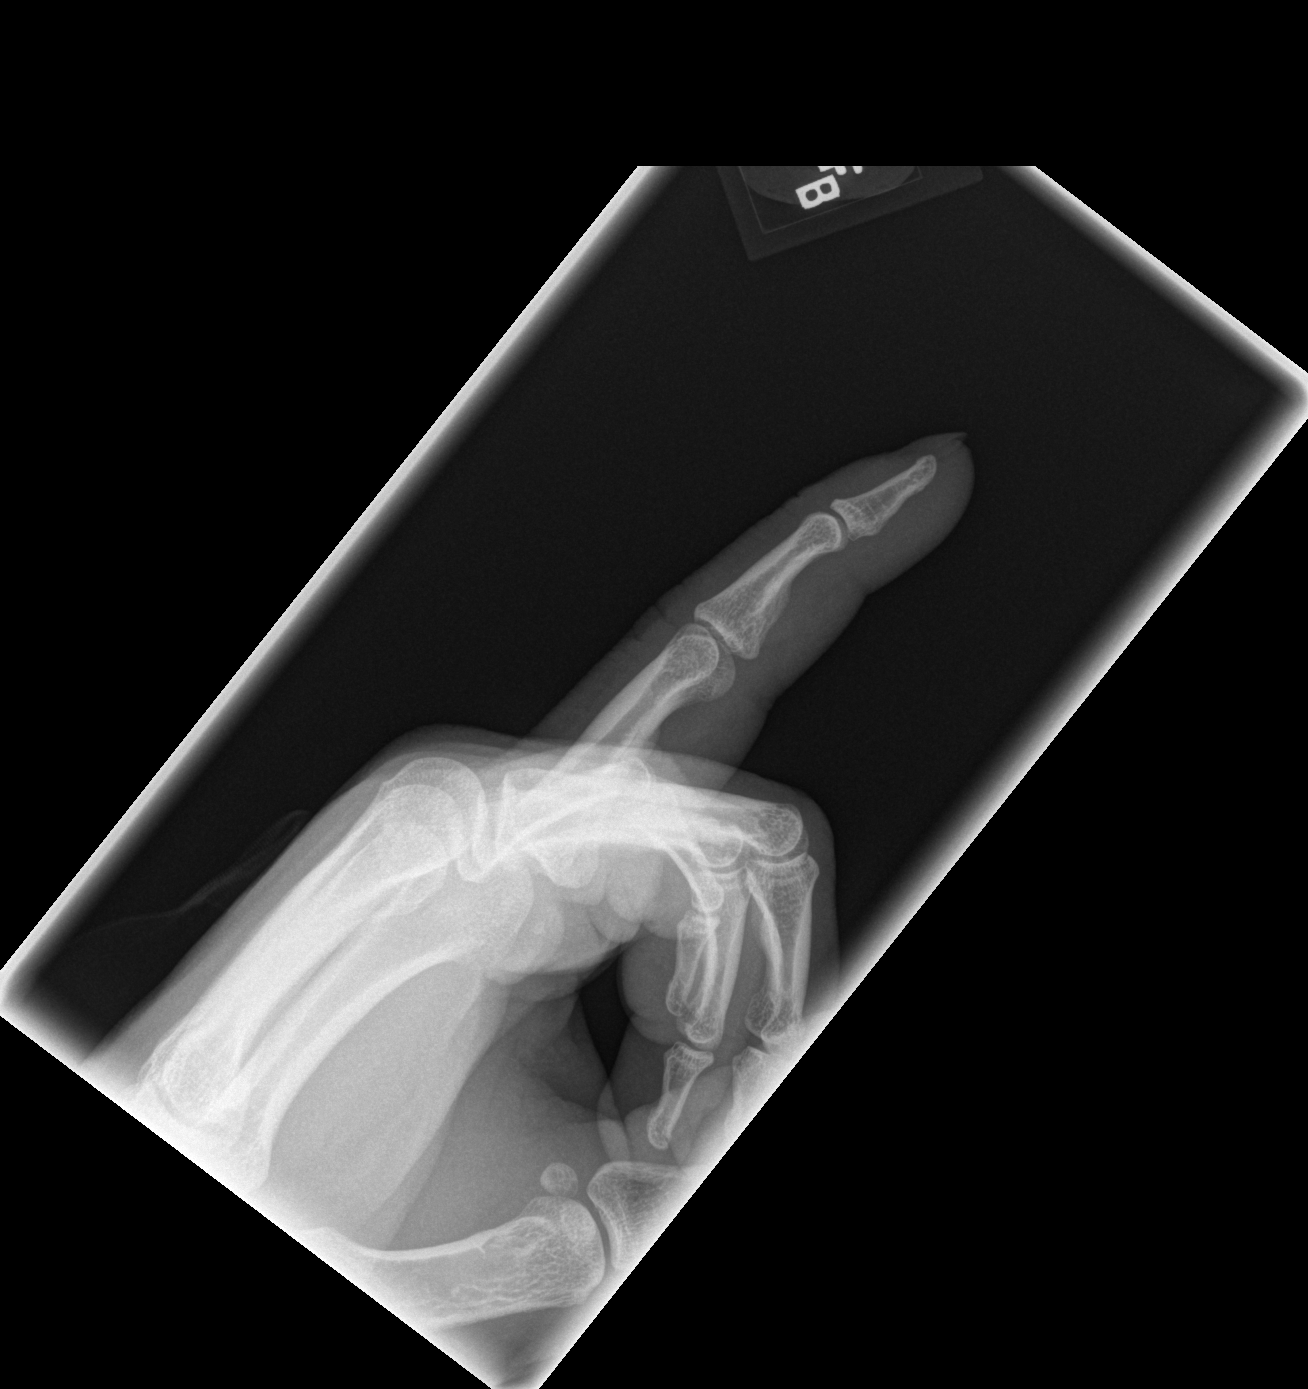

[3 of 3 positions shown; findings below may reference images not displayed]

FINDINGS: Soft tissue injury along the lateral aspect of the midportion of the
index finger is seen. No fracture. No air/gas in the joint. Tiny
fleck like opacity in the region of the soft tissue injury.
IMPRESSION: No bone or joint abnormality. Soft tissue injury. Tiny fleck like
opacity at the soft tissue laceration.

## 2019-11-03 ENCOUNTER — Ambulatory Visit: Payer: Medicaid Other | Admitting: Pharmacist

## 2019-12-15 ENCOUNTER — Telehealth: Payer: Self-pay | Admitting: Pharmacy Technician

## 2019-12-15 NOTE — Telephone Encounter (Signed)
[facility] Patient Advocate Encounter  [employer] Advancing Access Program sent a fax to notify that the patient's enrollment period ends on 03/26/201. The patient will need to re-enroll into the program to avoid lapse in coverage. Patient no showed the appointment on 11/03/2019.

## 2020-03-11 ENCOUNTER — Ambulatory Visit: Payer: Medicaid Other | Admitting: Pharmacist

## 2020-04-12 ENCOUNTER — Ambulatory Visit: Payer: Medicaid Other | Admitting: Pharmacist

## 2020-06-01 ENCOUNTER — Ambulatory Visit: Payer: Medicaid Other | Admitting: Pharmacist

## 2020-06-14 ENCOUNTER — Ambulatory Visit: Payer: Medicaid Other | Admitting: Pharmacist

## 2020-08-23 ENCOUNTER — Telehealth: Payer: Self-pay | Admitting: *Deleted

## 2020-08-23 NOTE — Telephone Encounter (Signed)
Patient is interested in restarting PrEP.  He states his partner is currently incarcerated. He would like to restart prep for when his partner is out of jail.  Patient scheduled for appointment 11/22 with Cassie. Andree Coss, RN

## 2020-09-10 ENCOUNTER — Telehealth: Payer: Self-pay

## 2020-09-10 NOTE — Telephone Encounter (Signed)
[facility] Patient Product/process development scientist completed.    The patient is uninsured and will need patient assistance for medication.  Patient have Family Planning Waiver [facility]) will not pay for prescriptions.  We can complete the application and will need to meet with the patient for signatures and income documentation.  Clearance Coots, CPhT Specialty Pharmacy Patient [facility] for Infectious Disease Phone: [phone] Fax:  [phone]

## 2020-09-12 NOTE — Progress Notes (Unsigned)
Date:  09/12/2020   HPI: Justin Arnold is a 34 y.o. male who presents to the [facility] pharmacy clinic for HIV PrEP follow-up.  Insured   []    Uninsured  [x]    There are no problems to display for this patient.   Patient's Medications  New Prescriptions   No medications on file  Previous Medications   EMTRICITABINE-TENOFOVIR AF (DESCOVY) 200-25 MG TABLET    Take 1 tablet by mouth daily.  Modified Medications   No medications on file  Discontinued Medications   No medications on file    Allergies: No Known Allergies  Past Medical History: Past Medical History:  Diagnosis Date  . Asthma   . Chronic back pain     Social History: Social History   Socioeconomic History  . Marital status: Single    Spouse name: Not on file  . Number of children: Not on file  . Years of education: Not on file  . Highest education level: Not on file  Occupational History  . Not on file  Tobacco Use  . Smoking status: Current Every Day Smoker    Packs/day: 0.50    Types: Cigarettes  . Smokeless tobacco: Never Used  Substance and Sexual Activity  . Alcohol use: No  . Drug use: No  . Sexual activity: Not on file  Other Topics Concern  . Not on file  Social History Narrative  . Not on file   Social Determinants of Health   Financial Resource Strain:   . Difficulty of Paying Living Expenses: Not on file  Food Insecurity:   . Worried About in the Last Year: Not on file  . Ran Out of Food in the Last Year: Not on file  Transportation Needs:   . Lack of Transportation (Medical): Not on file  . Lack of Transportation (Non-Medical): Not on file  Physical Activity:   . Days of Exercise per Week: Not on file  . Minutes of Exercise per Session: Not on file  Stress:   . Feeling of Stress : Not on file  Social Connections:   . Frequency of Communication with Friends and Family: Not on file  . Frequency of Social Gatherings with Friends and Family: Not on  file  . Attends Religious Services: Not on file  . Active Member of Clubs or Organizations: Not on file  . Attends Meetings: Not on file  . Marital Status: Not on file    CHL HIV PREP FLOWSHEET RESULTS 08/11/2019 01/15/2019  Insurance Status Uninsured Uninsured  Gender at birth Male Male  Gender identity cis-Male cis-Male  Risk for HIV In sexual relationship with HIV+ partner;Condomless vaginal or anal intercourse;Stimulant drug use in MSM;Injection drug use Condomless vaginal or anal intercourse;In sexual relationship with HIV+ partner;Injection drug use;Stimulant drug use in MSM  Sex Partners Men only Men only  # sex partners past 3-6 mos 1-3 1-3  Sex activity preferences Insertive and receptive;Oral Insertive and receptive;Oral  Condom use No No  Treated for STI? No No  HIV symptoms? - N/A  PrEP Eligibility Substantial risk for HIV Substantial risk for HIV  Paper work received? Yes -    Labs:  SCr: Lab Results  Component Value Date   CREATININE 0.93 08/11/2019   CREATININE 1.04 01/15/2019   HIV Lab Results  Component Value Date   HIV NON-REACTIVE 08/11/2019   HIV NON-REACTIVE 01/15/2019   Hepatitis B Lab Results  Component Value Date  HEPBSAB BORDERLINE (A) 01/15/2019   HEPBSAG NON-REACTIVE 01/15/2019   Hepatitis C Lab Results  Component Value Date   HEPCAB REACTIVE (A) 01/15/2019   HCVRNAPCRQN <15 DETECTED (A) 08/11/2019   Hepatitis A Lab Results  Component Value Date   HAV NON-REACTIVE 01/15/2019   RPR and STI Lab Results  Component Value Date   LABRPR NON-REACTIVE 08/11/2019   LABRPR NON-REACTIVE 01/15/2019    STI Results GC CT  08/11/2019 Negative Negative  01/15/2019 Negative Negative  01/15/2019 Negative Negative  01/15/2019 Negative Negative  .ck  Assessment: ***  PREP Restart and potential candidate for PURPOSE 2 trial  Partner is currently incarcerated. He would like to restart prep for when his partner is out of  jail  Last labs 07/2019  - BMET wnl - neg STDs - HepC undetectable - HIV negative  Last visit with Cassie in 07/2019  PLAN: HIV antibody, CBC, BMET, gonorrhea/chlamydia (pharyngeal, rectal, and urine specimens)  -uninsured on STDs  Uninsured - will fill out docs with [facility] - One tablet once daily with or without food Signs/symptoms of HIV (fever, fatigue, coughing) Signs/symptoms of STDs (urinary frequency, burning, itching, discharge) Any new partners? Using condoms?? Percentage?? Started any new meds? Pharmacy? Will send refills for 3 months if HIV negative  Schedule 46-month follow-up appt -lab appointment in 3 months (end of February) - ONLY labs  -end of May with cassie   Descovy . ADE:  "start up syndrome" with nausea, diarrhea, dizziness, and fatigue but that those should resolve in a few weeks; Headache, abdominal pain,  Plan: ***  Fabio Neighbors, PharmD PGY2 Ambulatory Care Resident [facility]armacy

## 2020-09-13 ENCOUNTER — Ambulatory Visit: Payer: Medicaid Other | Admitting: Pharmacist

## 2020-10-29 ENCOUNTER — Other Ambulatory Visit: Payer: Self-pay

## 2020-10-29 ENCOUNTER — Ambulatory Visit (INDEPENDENT_AMBULATORY_CARE_PROVIDER_SITE_OTHER): Payer: Self-pay | Admitting: Pharmacist

## 2020-10-29 DIAGNOSIS — B182 Chronic viral hepatitis C: Secondary | ICD-10-CM

## 2020-10-29 DIAGNOSIS — Z113 Encounter for screening for infections with a predominantly sexual mode of transmission: Secondary | ICD-10-CM

## 2020-10-29 DIAGNOSIS — Z7252 High risk homosexual behavior: Secondary | ICD-10-CM

## 2020-10-29 DIAGNOSIS — Z79899 Other long term (current) drug therapy: Secondary | ICD-10-CM

## 2020-10-29 LAB — BASIC METABOLIC PANEL
CO2: 27 mmol/L (ref 20–32)
Potassium: 4.4 mmol/L (ref 3.5–5.3)

## 2020-10-29 NOTE — Progress Notes (Signed)
Date:  11/01/2020   HPI: Justin Arnold is a 35 y.o. male who presents to the [facility] pharmacy clinic for HIV PrEP follow-up.  Insured   [x]    Uninsured  []    There are no problems to display for this patient.   Patient's Medications  New Prescriptions   No medications on file  Previous Medications   EMTRICITABINE-TENOFOVIR AF (DESCOVY) 200-25 MG TABLET    Take 1 tablet by mouth daily.  Modified Medications   No medications on file  Discontinued Medications   No medications on file    Allergies: No Known Allergies  Past Medical History: Past Medical History:  Diagnosis Date  . Asthma   . Chronic back pain     Social History: Social History   Socioeconomic History  . Marital status: Single    Spouse name: Not on file  . Number of children: Not on file  . Years of education: Not on file  . Highest education level: Not on file  Occupational History  . Not on file  Tobacco Use  . Smoking status: Current Every Day Smoker    Packs/day: 0.50    Types: Cigarettes  . Smokeless tobacco: Never Used  Substance and Sexual Activity  . Alcohol use: No  . Drug use: No  . Sexual activity: Not on file  Other Topics Concern  . Not on file  Social History Narrative  . Not on file   Social Determinants of Health   Financial Resource Strain: Not on file  Food Insecurity: Not on file  Transportation Needs: Not on file  Physical Activity: Not on file  Stress: Not on file  Social Connections: Not on file    CHL HIV PREP FLOWSHEET RESULTS 08/11/2019 01/15/2019  Insurance Status Uninsured Uninsured  Gender at birth Male Male  Gender identity cis-Male cis-Male  Risk for HIV In sexual relationship with HIV+ partner;Condomless vaginal or anal intercourse;Stimulant drug use in MSM;Injection drug use Condomless vaginal or anal intercourse;In sexual relationship with HIV+ partner;Injection drug use;Stimulant drug use in MSM  Sex Partners Men only Men only  # sex partners  past 3-6 mos 1-3 1-3  Sex activity preferences Insertive and receptive;Oral Insertive and receptive;Oral  Condom use No No  Treated for STI? No No  HIV symptoms? - N/A  PrEP Eligibility Substantial risk for HIV Substantial risk for HIV  Paper work received? Yes -    Labs:  SCr: Lab Results  Component Value Date   CREATININE 0.90 10/29/2020   CREATININE 0.93 08/11/2019   CREATININE 1.04 01/15/2019   HIV Lab Results  Component Value Date   HIV NON-REACTIVE 10/29/2020   HIV NON-REACTIVE 08/11/2019   HIV NON-REACTIVE 01/15/2019   Hepatitis B Lab Results  Component Value Date   HEPBSAB BORDERLINE (A) 01/15/2019   HEPBSAG NON-REACTIVE 01/15/2019   Hepatitis C Lab Results  Component Value Date   HEPCAB REACTIVE (A) 01/15/2019   HCVRNAPCRQN <15 DETECTED (A) 08/11/2019   Hepatitis A Lab Results  Component Value Date   HAV NON-REACTIVE 01/15/2019   RPR and STI Lab Results  Component Value Date   LABRPR NON-REACTIVE 10/29/2020   LABRPR NON-REACTIVE 08/11/2019   LABRPR NON-REACTIVE 01/15/2019    STI Results GC CT  08/11/2019 Negative Negative  01/15/2019 Negative Negative  01/15/2019 Negative Negative  01/15/2019 Negative Negative    Assessment: 01/17/2019 comes in today with his partner who I was seeing for being out of care for his HIV. I last saw 01/17/2019 in  October 2020 for PrEP, so he has been off of medications for over a year. His partner is HIV positive, also not on medications, and has a high viral load of 91,000. They sometimes use condoms but not all the time.  I will recheck labs and restart him on Descovy if he is negative. Of note, he was previously positive for Hepatitis C but appears to have cleared the infection himself. I will check another Hep C RNA to confirm.  Plan: - HIV antibody, BMET, RPR, urine cytology, Hep C RNA today - Descovy x 3 months if HIV negative - F/u in 3 months  Justin Arnold L. Duane Trias, PharmD, BCIDP, AAHIVP, CPP Clinical Pharmacist  Practitioner Infectious Diseases Clinical Pharmacist [facility] 11/01/2020, 12:02 PM

## 2020-11-01 ENCOUNTER — Telehealth: Payer: Self-pay

## 2020-11-01 ENCOUNTER — Other Ambulatory Visit: Payer: Self-pay | Admitting: Pharmacist

## 2020-11-01 DIAGNOSIS — Z7252 High risk homosexual behavior: Secondary | ICD-10-CM

## 2020-11-01 LAB — URINE CYTOLOGY ANCILLARY ONLY
Chlamydia: NEGATIVE
Comment: NEGATIVE
Comment: NORMAL
Neisseria Gonorrhea: NEGATIVE

## 2020-11-01 MED ORDER — DESCOVY 200-25 MG PO TABS: 1.0000 | ORAL_TABLET | Freq: Every day | ORAL | 2 refills | Status: DC

## 2020-11-01 MED FILL — DESCOVY 200-25 MG TABS: 200-25 | 30 days supply | Qty: 30 | Fill #0

## 2020-11-01 NOTE — Progress Notes (Signed)
Patient's HIV antibody is negative.  Will send in 3 more months of Descovy to [facility].  

## 2020-11-01 NOTE — Telephone Encounter (Addendum)
[facility] Patient Advocate Encounter  Completed and sent [employer] Advancing Access application for Descovy for this patient who is uninsured.    Patient is approved 11/01/20 through 10/22/21  BIN      [id number] PCN    [id number] GRP    [id number] ID        [id number]   Clearance Coots, CPhT Specialty Pharmacy Patient [facility] Phone: [phone] Fax:  [phone]

## 2020-11-04 LAB — BASIC METABOLIC PANEL
BUN: 23 mg/dL (ref 7–25)
Calcium: 9.8 mg/dL (ref 8.6–10.3)
Chloride: 104 mmol/L (ref 98–110)
Creat: 0.9 mg/dL (ref 0.60–1.35)
Glucose, Bld: 83 mg/dL (ref 65–99)
Sodium: 139 mmol/L (ref 135–146)

## 2020-11-04 LAB — RPR: RPR Ser Ql: NONREACTIVE

## 2020-11-04 LAB — HEPATITIS C RNA QUANTITATIVE
HCV Quantitative Log: <1.18 log IU/mL
HCV RNA, PCR, QN: <15 IU/mL

## 2020-11-04 LAB — HIV ANTIBODY (ROUTINE TESTING W REFLEX): HIV 1&2 Ab, 4th Generation: NONREACTIVE

## 2020-11-28 ENCOUNTER — Ambulatory Visit
Admission: RE | Admit: 2020-11-28 | Discharge: 2020-11-28 | Disposition: A | Discharge status: Home / Self Care | Payer: Medicaid Other | Source: Ambulatory Visit | Attending: Family Medicine | Admitting: Family Medicine

## 2020-11-28 ENCOUNTER — Other Ambulatory Visit: Payer: Self-pay

## 2020-11-28 ENCOUNTER — Ambulatory Visit (INDEPENDENT_AMBULATORY_CARE_PROVIDER_SITE_OTHER): Payer: HRSA Program

## 2020-11-28 VITALS — BP 139/87 | HR 109 | Temp 97.3°F | Resp 18

## 2020-11-28 DIAGNOSIS — J1282 Pneumonia due to coronavirus disease 2019: Secondary | ICD-10-CM | POA: Diagnosis not present

## 2020-11-28 DIAGNOSIS — U071 COVID-19: Secondary | ICD-10-CM | POA: Diagnosis not present

## 2020-11-28 DIAGNOSIS — R0789 Other chest pain: Secondary | ICD-10-CM

## 2020-11-28 DIAGNOSIS — L02213 Cutaneous abscess of chest wall: Secondary | ICD-10-CM

## 2020-11-28 DIAGNOSIS — J90 Pleural effusion, not elsewhere classified: Secondary | ICD-10-CM

## 2020-11-28 DIAGNOSIS — Z8616 Personal history of COVID-19: Secondary | ICD-10-CM

## 2020-11-28 MED ORDER — CEFTRIAXONE SODIUM 1 G IJ SOLR
1.0000 g | Freq: Once | INTRAMUSCULAR | Status: AC
  Administered 2020-11-28: 1 g via INTRAMUSCULAR

## 2020-11-28 NOTE — ED Triage Notes (Signed)
Was told he had covid pneumonia 1/23.  Wants to be rechecked for pneumonia states he is still having pain when he takes a deep breath.  States she feels SOB.  Also states he has a spot on his chest for 2 months that looks like a pimple, but has gotten larger.

## 2020-11-28 NOTE — ED Provider Notes (Signed)
[facility]    CSN: [id number] Arrival date & time: 11/28/20  1353      History   Chief Complaint No chief complaint on file.   HPI Justin Arnold is a 35 y.o. male.   Reports that he had covid pneumonia on 11/14/20. Reports chest pain with deep breathing, SOB, fatigued for the last 2 weeks. Also reports that there is a bump on his chest that has been there for about 2 months. Reports that he has tried to pop it himself with little relief. States that the area has gotten larger and more painful. Has not attempted OTC treatment. Denies headache, cough, nausea, vomiting, diarrhea, fever, other symptoms. He has a history of high risk sexual behaviors, obesity, covid 19 pneumonia. Chart states that he is taking Descovy for PreP, but patient states that he is not taking any medications at this time.  ROS per HPI  The history is provided by the patient.    Past Medical History:  Diagnosis Date  . Asthma   . Chronic back pain     There are no problems to display for this patient.   Past Surgical History:  Procedure Laterality Date  . TONSILLECTOMY         Home Medications    Prior to Admission medications   Medication Sig Start Date End Date Taking? Authorizing Provider  emtricitabine-tenofovir AF (DESCOVY) 200-25 MG tablet Take 1 tablet by mouth daily. 11/01/20   Kuppelweiser, Cassie L, RPH-CPP    Family History History reviewed. No pertinent family history.  Social History Social History   Tobacco Use  . Smoking status: Current Every Day Smoker    Packs/day: 0.50    Types: Cigarettes  . Smokeless tobacco: Never Used  Substance Use Topics  . Alcohol use: No  . Drug use: No     Allergies   Patient has no known allergies.   Review of Systems Review of Systems   Physical Exam Triage Vital Signs ED Triage Vitals  Enc Vitals Group     BP 11/28/20 1402 139/87     Pulse Rate 11/28/20 1402 (!) 109     Resp 11/28/20 1402 18     Temp  11/28/20 1402 (!) 97.3 F (36.3 C)     Temp Source 11/28/20 1402 Oral     SpO2 11/28/20 1402 96 %     Weight --      Height --      Head Circumference --      Peak Flow --      Pain Score 11/28/20 1404 7     Pain Loc --      Pain Edu? --      Excl. in GC? --    No data found.  Updated Vital Signs BP 139/87 (BP Location: Right Arm)   Pulse (!) 109   Temp (!) 97.3 F (36.3 C) (Oral)   Resp 18   SpO2 96%      Physical Exam Vitals and nursing note reviewed.  Constitutional:      Appearance: Normal appearance. He is well-developed and well-nourished.  HENT:     Head: Normocephalic and atraumatic.     Mouth/Throat:     Mouth: Mucous membranes are moist.     Pharynx: Oropharynx is clear.  Eyes:     Extraocular Movements: Extraocular movements intact.     Conjunctiva/sclera: Conjunctivae normal.     Pupils: Pupils are equal, round, and reactive to light.  Cardiovascular:  Rate and Rhythm: Normal rate and regular rhythm.     Heart sounds: Normal heart sounds. No murmur heard.   Pulmonary:     Effort: Pulmonary effort is normal. No respiratory distress.     Breath sounds: Normal breath sounds. No stridor. No wheezing, rhonchi or rales.  Chest:     Chest wall: No tenderness.  Abdominal:     Palpations: Abdomen is soft.     Tenderness: There is no abdominal tenderness.  Musculoskeletal:        General: No edema. Normal range of motion.     Cervical back: Normal range of motion and neck supple.  Skin:    General: Skin is warm and dry.     Capillary Refill: Capillary refill takes less than 2 seconds.  Neurological:     General: No focal deficit present.     Mental Status: He is alert and oriented to person, place, and time. Mental status is at baseline.  Psychiatric:        Mood and Affect: Mood and affect and mood normal.        Behavior: Behavior normal.        Thought Content: Thought content normal.      UC Treatments / Results  Labs (all labs ordered  are listed, but only abnormal results are displayed) Labs Reviewed - No data to display  EKG   Radiology DG Chest 2 View  Result Date: 11/28/2020 CLINICAL DATA:  Recent diagnosis of COVID pneumonia. EXAM: CHEST - 2 VIEW COMPARISON:  11/14/2020 CT abdomen pelvis-11/14/2020 FINDINGS: Grossly unchanged cardiac silhouette and mediastinal contours. Improved aeration of the left lung base with interval reduction in persistent trace partially loculated left-sided effusion and minimal left perihilar airspace opacities. The right hemithorax remains well aerated. No evidence of edema. No pneumothorax. No acute osseous abnormalities. Unchanged mild (approximately 25%) compression deformity involving the T12 vertebral body with associated focal kyphosis at this location. IMPRESSION: Improved aeration of the left lung base with persistent trace partially loculated left-sided effusion and left perihilar opacities. Follow-up chest radiograph in 3-4 weeks is recommended to ensure either complete resolution or to establish a new baseline. Electronically Signed   By: Simonne Come M.D.   On: 11/28/2020 14:12    Procedures Procedures (including critical care time)  Medications Ordered in UC Medications  cefTRIAXone (ROCEPHIN) injection 1 g (1 g Intramuscular Given 11/28/20 1437)    Initial Impression / Assessment and Plan / UC Course  I have reviewed the triage vital signs and the nursing notes.  Pertinent labs & imaging results that were available during my care of the patient were reviewed by me and considered in my medical decision making (see chart for details).     Pleural Effusion Chest discomfort History of Covid 19 Cutaneous Abscess  Rocephin 1g given for abscess with cellulitis Chest xray today shows L pleural effusion Patient is diaphoretic on exam, denies chest pain Left rib pain when deep breathing, nontender Upon chart review, patient was seen at [facility] ER and the note states that he  was a candidate for inpatient admission, but left AMA Discussed that he would be best served in the ER, declines today If your SOB, fatigue, weakness does not improve, follow up in the ER for further evaluation and treatment If high fever, trouble swallowing, trouble breathing develops, follow up in the ER for further evaluation and treatment  Final Clinical Impressions(s) / UC Diagnoses   Final diagnoses:  Chest discomfort  History  of COVID-19  Cutaneous abscess of chest wall  Pleural effusion     Discharge Instructions     Treated cellulitis with 1g Rocephin in office today  Xray shows left pleural effusion  If your symptoms of shortness of breath, fatigue, or weakness get worse, go to the ER for further evaluation and treatment  If you get a high fever, have trouble swallowing, trouble breathing, other concerning symptoms, follow up in the ER for further evaluation and treatment    ED Prescriptions    None     PDMP not reviewed this encounter.   Moshe Cipro, NP 11/28/20 [phone]

## 2020-11-28 NOTE — Discharge Instructions (Signed)
Treated cellulitis with 1g Rocephin in office today  Xray shows left pleural effusion  If your symptoms of shortness of breath, fatigue, or weakness get worse, go to the ER for further evaluation and treatment  If you get a high fever, have trouble swallowing, trouble breathing, other concerning symptoms, follow up in the ER for further evaluation and treatment

## 2021-01-20 ENCOUNTER — Other Ambulatory Visit (HOSPITAL_COMMUNITY): Payer: Self-pay

## 2021-02-03 ENCOUNTER — Other Ambulatory Visit (HOSPITAL_COMMUNITY): Payer: Self-pay

## 2021-11-30 ENCOUNTER — Other Ambulatory Visit: Payer: Self-pay

## 2021-11-30 ENCOUNTER — Encounter (HOSPITAL_BASED_OUTPATIENT_CLINIC_OR_DEPARTMENT_OTHER): Payer: Self-pay

## 2021-11-30 ENCOUNTER — Emergency Department (HOSPITAL_BASED_OUTPATIENT_CLINIC_OR_DEPARTMENT_OTHER)
Admission: EM | Admit: 2021-11-30 | Discharge: 2021-12-01 | Disposition: A | Discharge status: Home / Self Care | Payer: Medicaid Other | Attending: Emergency Medicine | Admitting: Emergency Medicine

## 2021-11-30 DIAGNOSIS — H109 Unspecified conjunctivitis: Secondary | ICD-10-CM | POA: Insufficient documentation

## 2021-11-30 MED ORDER — TETRACAINE HCL 0.5 % OP SOLN
2.0000 [drp] | Freq: Once | OPHTHALMIC | Status: AC
  Administered 2021-12-01: 2 [drp] via OPHTHALMIC
  Filled 2021-11-30: qty 4

## 2021-11-30 MED ORDER — FLUORESCEIN SODIUM 1 MG OP STRP
1.0000 | ORAL_STRIP | Freq: Once | OPHTHALMIC | Status: AC
  Administered 2021-12-01: 1 via OPHTHALMIC
  Filled 2021-11-30: qty 1

## 2021-11-30 NOTE — ED Triage Notes (Signed)
Pt c/o redness to right sclera x 2-3 days-denies-states he does wear contact-took right contact out last night-NAD-steady gait

## 2021-12-01 MED ORDER — SULFACETAMIDE SODIUM 10 % OP SOLN
1.0000 [drp] | OPHTHALMIC | Status: DC
  Administered 2021-12-01: 1 [drp] via OPHTHALMIC
  Filled 2021-12-01: qty 15

## 2021-12-01 NOTE — ED Provider Notes (Signed)
[facility] EMERGENCY DEPARTMENT Provider Note   CSN: [id number] Arrival date & time: 11/30/21  1933     History  Chief Complaint  Patient presents with   Eye Problem    MUKUND WEINREB is a 36 y.o. male.  Patient presents to the emergency department for evaluation of eye problem.  Patient reports that he has been having irritation, redness, slight pain and tearing from his right eye for the last 2 to 3 days.  He does wear contact lenses, currently is not wearing them.  Denies any direct injury.  No other cold symptoms.      Home Medications Prior to Admission medications   Not on File      Allergies    Patient has no known allergies.    Review of Systems   Review of Systems  Eyes:  Positive for redness.   Physical Exam Updated Vital Signs BP (!) 161/89 (BP Location: Left Arm)    Pulse (!) 107    Temp 97.9 F (36.6 C) (Oral)    Resp 20    SpO2 98%  Physical Exam Vitals and nursing note reviewed.  Constitutional:      General: He is not in acute distress.    Appearance: He is well-developed.  HENT:     Head: Normocephalic and atraumatic.  Eyes:     General: Lids are normal. Vision grossly intact. Gaze aligned appropriately.        Right eye: No foreign body, discharge or hordeolum.     Intraocular pressure: Right eye pressure is 15 mmHg. Measurements were taken using an automated tonometer.    Extraocular Movements: Extraocular movements intact.     Conjunctiva/sclera:     Right eye: Right conjunctiva is injected. No exudate or hemorrhage.    Pupils: Pupils are equal, round, and reactive to light.     Right eye: No corneal abrasion or fluorescein uptake. Seidel exam negative.  Cardiovascular:     Rate and Rhythm: Normal rate and regular rhythm.     Heart sounds: No murmur heard. Pulmonary:     Effort: Pulmonary effort is normal. No respiratory distress.     Breath sounds: Normal breath sounds.  Abdominal:     Palpations: Abdomen is soft.      Tenderness: There is no abdominal tenderness.  Musculoskeletal:        General: No swelling.     Cervical back: Neck supple.  Skin:    General: Skin is warm and dry.     Capillary Refill: Capillary refill takes less than 2 seconds.  Neurological:     Mental Status: He is alert.  Psychiatric:        Mood and Affect: Mood normal.    ED Results / Procedures / Treatments   Labs (all labs ordered are listed, but only abnormal results are displayed) Labs Reviewed - No data to display  EKG None  Radiology No results found.  Procedures Procedures    Medications Ordered in ED Medications  tetracaine (PONTOCAINE) 0.5 % ophthalmic solution 2 drop (has no administration in time range)  fluorescein ophthalmic strip 1 strip (has no administration in time range)  sulfacetamide (BLEPH-10) 10 % ophthalmic solution 1 drop (has no administration in time range)    ED Course/ Medical Decision Making/ A&P                           Medical Decision Making Risk Prescription drug management.  Patient presents with right eye irritation and redness.  Patient has scleral injection noted.  Cornea appears normal.  No fluorescein uptake.  Intraocular pressure normal.  No hyphema, no hypopyon.  Pupil regular and reactive.  Treat as conjunctivitis, follow-up with ophthalmology if not improving after several days of antibiotics.        Final Clinical Impression(s) / ED Diagnoses Final diagnoses:  Conjunctivitis of right eye, unspecified conjunctivitis type    Rx / DC Orders ED Discharge Orders     None         Lakysha Kossman, Canary Brim, MD 12/01/21 0007

## 2022-03-27 ENCOUNTER — Emergency Department (HOSPITAL_COMMUNITY)
Admission: EM | Admit: 2022-03-27 | Discharge: 2022-03-27 | Disposition: A | Discharge status: Home / Self Care | Payer: Self-pay | Attending: Emergency Medicine | Admitting: Emergency Medicine

## 2022-03-27 ENCOUNTER — Encounter (HOSPITAL_COMMUNITY): Payer: Self-pay

## 2022-03-27 ENCOUNTER — Other Ambulatory Visit: Payer: Self-pay

## 2022-03-27 DIAGNOSIS — Y93E5 Activity, floor mopping and cleaning: Secondary | ICD-10-CM | POA: Insufficient documentation

## 2022-03-27 DIAGNOSIS — S61211A Laceration without foreign body of left index finger without damage to nail, initial encounter: Secondary | ICD-10-CM | POA: Insufficient documentation

## 2022-03-27 DIAGNOSIS — W25XXXA Contact with sharp glass, initial encounter: Secondary | ICD-10-CM | POA: Insufficient documentation

## 2022-03-27 MED ORDER — BACITRACIN ZINC 500 UNIT/GM EX OINT
TOPICAL_OINTMENT | CUTANEOUS | Status: AC
  Filled 2022-03-27: qty 0.9

## 2022-03-27 NOTE — Discharge Instructions (Signed)
Local wound care with bacitracin and dressing changes twice daily.  Sutures are to be removed in 1 week.  Please follow-up with your primary doctor for this.  Return to the emergency department if you develop redness surrounding the wound, pus draining from the wound, or other new and concerning symptoms.

## 2022-03-27 NOTE — ED Notes (Signed)
Dr. Delo at bedside. 

## 2022-03-27 NOTE — ED Provider Notes (Signed)
  [facility] EMERGENCY DEPARTMENT Provider Note   CSN: [id number] Arrival date & time: 03/27/22  0057     History  Chief Complaint  Patient presents with   Laceration    RAYSHAD RIVIELLO is a 36 y.o. male.  Patient is a 36 year old male presenting with a left index finger injury.  Patient was cleaning in his house yesterday when he cut his finger on a piece of broken glass.  He has had bleeding since that he is having difficulty controlling.  The history is provided by the patient.  Laceration Location:  Hand Hand laceration location:  L hand Depth:  Through underlying tissue Quality: straight   Time since incident:  24 hours     Home Medications Prior to Admission medications   Not on File      Allergies    Patient has no known allergies.    Review of Systems   Review of Systems  All other systems reviewed and are negative.  Physical Exam Updated Vital Signs BP 139/83   Pulse (!) 110   Temp 98.1 F (36.7 C)   Resp 19   Ht 5\' 9"  (1.753 m)   Wt 127 kg   SpO2 95%   BMI 41.35 kg/m  Physical Exam Vitals and nursing note reviewed.  Constitutional:      Appearance: Normal appearance.  HENT:     Head: Normocephalic and atraumatic.  Pulmonary:     Effort: Pulmonary effort is normal.  Musculoskeletal:     Comments: There is a 1 cm laceration noted to the extensor surface of the left index finger just distal to the MCP joint.  There is arterial bleeding coming from this spot.  Capillary refill is brisk.  Skin:    General: Skin is warm and dry.  Neurological:     Mental Status: He is alert.    ED Results / Procedures / Treatments   Labs (all labs ordered are listed, but only abnormal results are displayed) Labs Reviewed - No data to display  EKG None  Radiology No results found.  Procedures Procedures    Medications Ordered in ED Medications - No data to display  ED Course/ Medical Decision Making/ A&P  Patient presenting with a  laceration to the base of the left index finger.  There is arterial bleeding coming from the site the patient has been having difficulty controlling.  This was controlled with 2 sutures here in the ER.  Patient will be discharged with local wound care and as needed return.  LACERATION REPAIR Performed by: Authorized by: Geoffery Lyons Consent: Verbal consent obtained. Risks and benefits: risks, benefits and alternatives were discussed Consent given by: patient Patient identity confirmed: provided demographic data Prepped and Draped in normal sterile fashion Wound explored  Laceration Location: Left index finger  Laceration Length: 1 cm  No Foreign Bodies seen or palpated  Anesthesia: local infiltration  Local anesthetic: lidocaine 1% without epinephrine  Anesthetic total: 1 ml  Irrigation method: syringe Amount of cleaning: standard  Skin closure: 4-0 Prolene  Number of sutures: 2  Technique: Simple interrupted  Patient tolerance: Patient tolerated the procedure well with no immediate complications.   Final Clinical Impression(s) / ED Diagnoses Final diagnoses:  None    Rx / DC Orders ED Discharge Orders     None         Geoffery Lyons, MD 03/27/22 0128

## 2022-03-27 NOTE — ED Triage Notes (Signed)
Pt states he was cleaning a broken window yesterday when he cut his left index finger. Has not been able to control bleeding at home. Actively bleeding, dressing applied

## 2022-03-27 NOTE — ED Notes (Signed)
Laceration  Stitched by MD Pt hand wrapped: bacitracin applied over stitches Extra ,materials given for home

## 2022-08-03 IMAGING — DX DG CHEST 2V
2 series · 2 of 2 positions shown · non-contrast
Comparison: 11/14/2020 CT abdomen pelvis-11/14/2020

CLINICAL DATA: Recent diagnosis of COVID pneumonia.

EXAM:
CHEST - 2 VIEW

[chest pa]
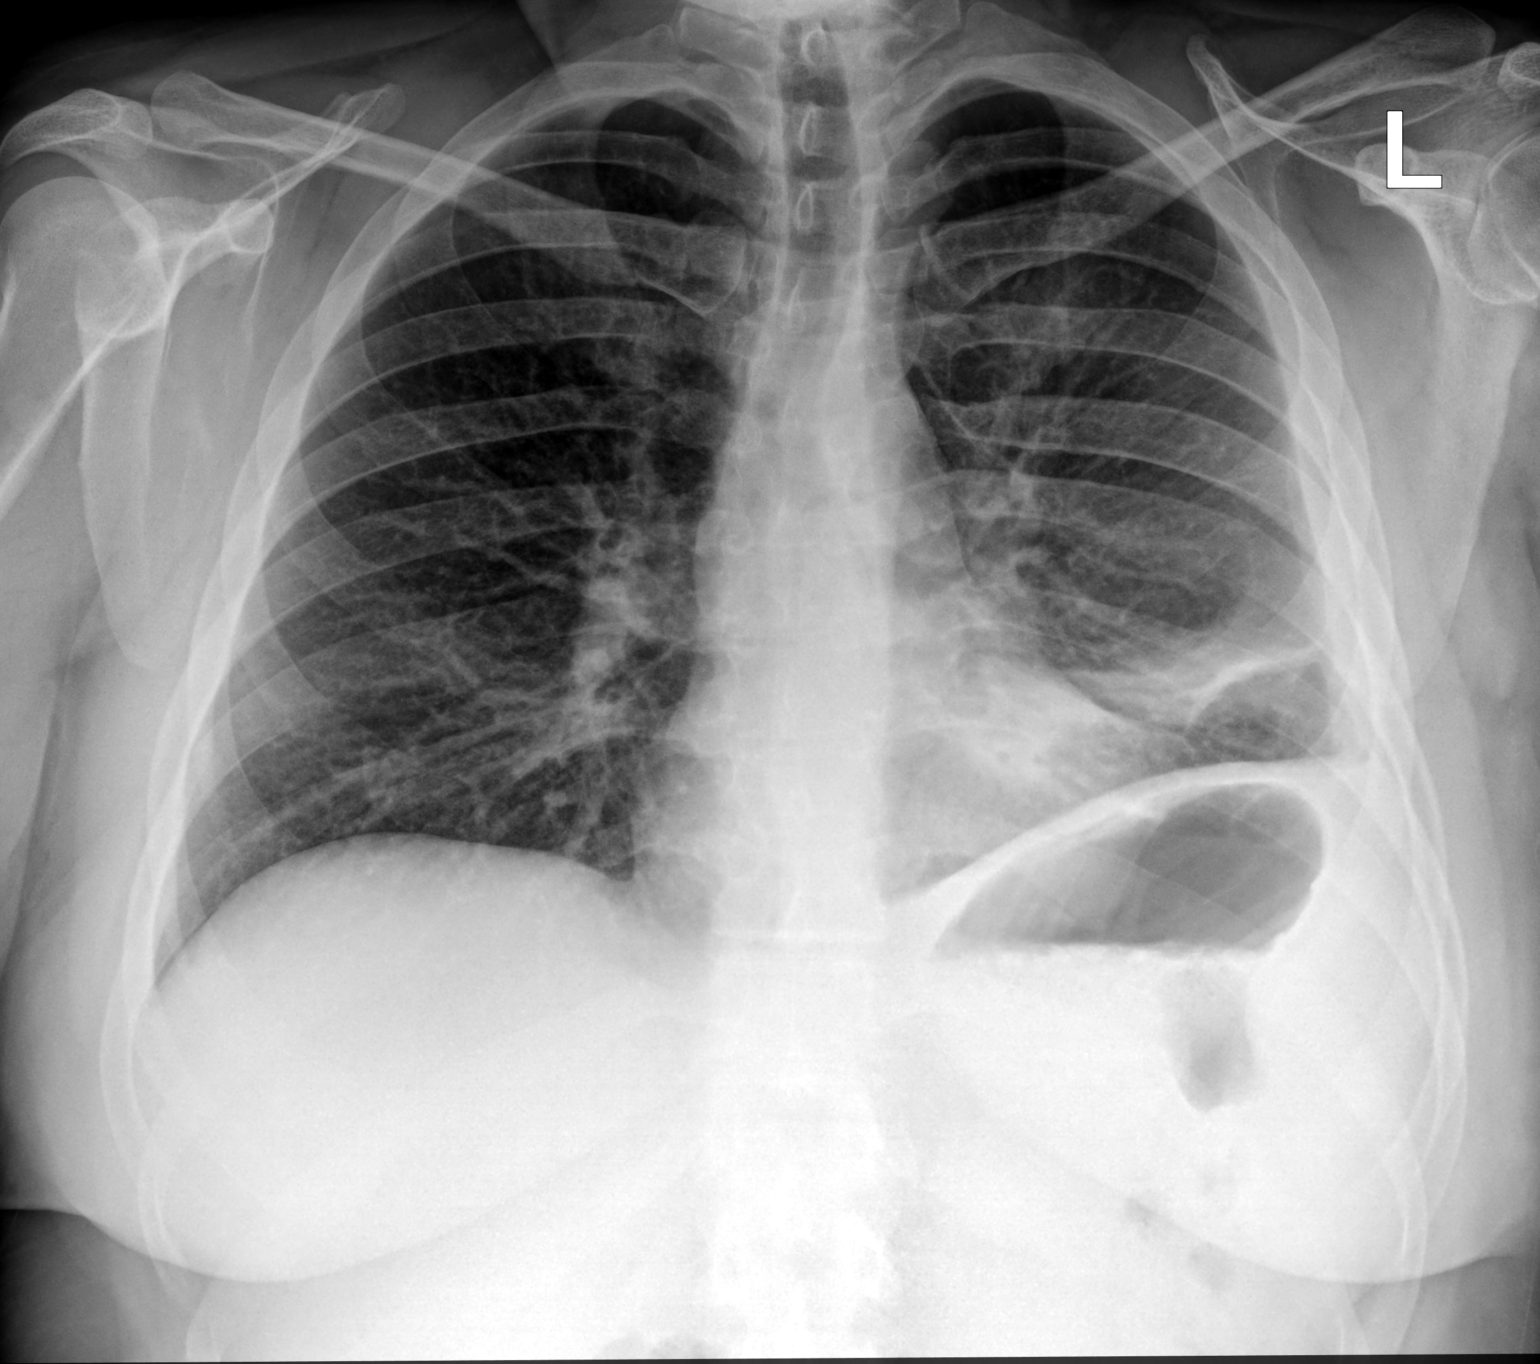

[chest lat]
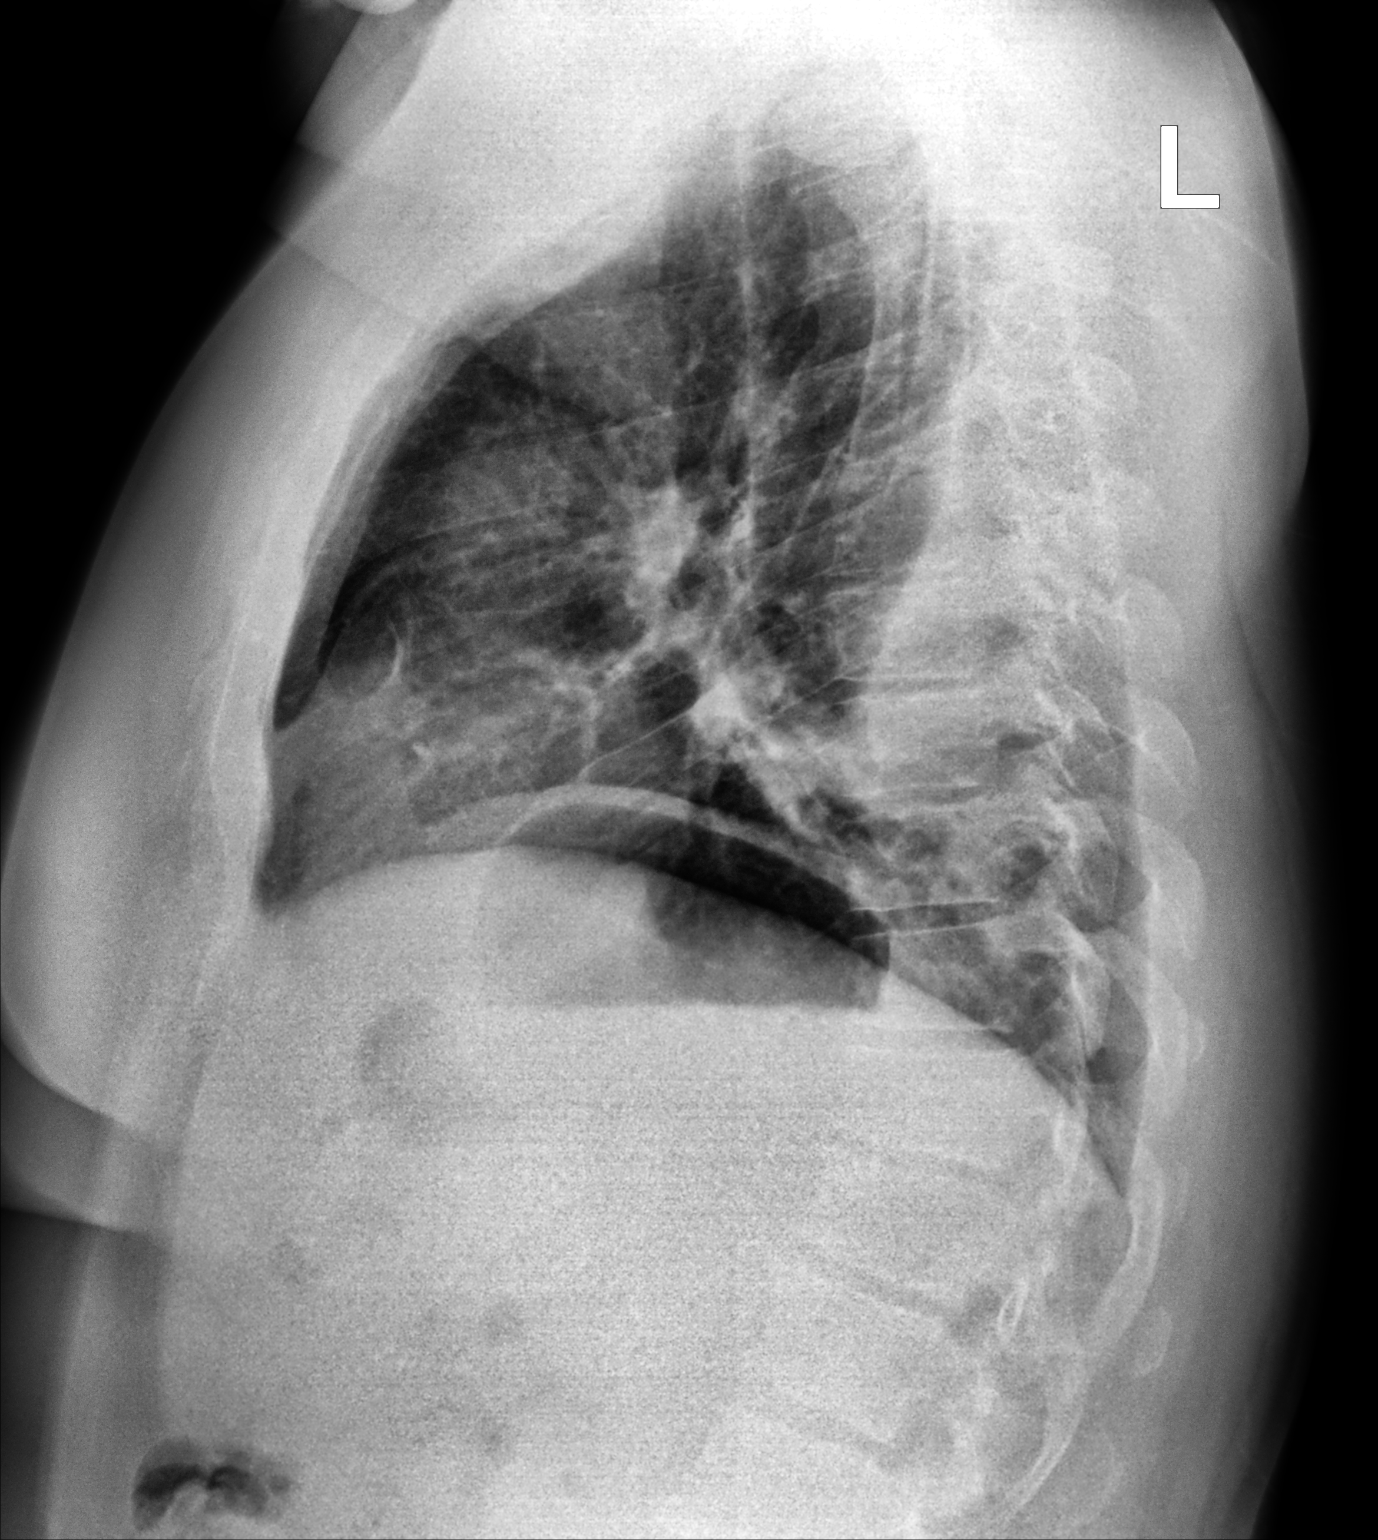

[2 of 2 positions shown; findings below may reference images not displayed]

FINDINGS: Grossly unchanged cardiac silhouette and mediastinal contours.
Improved aeration of the left lung base with interval reduction in
persistent trace partially loculated left-sided effusion and minimal
left perihilar airspace opacities. The right hemithorax remains well
aerated. No evidence of edema. No pneumothorax. No acute osseous
abnormalities. Unchanged mild (approximately 25%) compression
deformity involving the T12 vertebral body with associated focal
kyphosis at this location.
IMPRESSION: Improved aeration of the left lung base with persistent trace
partially loculated left-sided effusion and left perihilar
opacities. Follow-up chest radiograph in 3-4 weeks is recommended to
ensure either complete resolution or to establish a new baseline.

## 2023-06-27 DIAGNOSIS — F1120 Opioid dependence, uncomplicated: Secondary | ICD-10-CM | POA: Diagnosis not present

## 2023-06-27 DIAGNOSIS — F1510 Other stimulant abuse, uncomplicated: Secondary | ICD-10-CM | POA: Diagnosis not present

## 2023-06-27 DIAGNOSIS — F17200 Nicotine dependence, unspecified, uncomplicated: Secondary | ICD-10-CM | POA: Diagnosis not present

## 2023-07-02 DIAGNOSIS — F17200 Nicotine dependence, unspecified, uncomplicated: Secondary | ICD-10-CM | POA: Diagnosis not present

## 2023-07-02 DIAGNOSIS — F1510 Other stimulant abuse, uncomplicated: Secondary | ICD-10-CM | POA: Diagnosis not present

## 2023-07-02 DIAGNOSIS — F1120 Opioid dependence, uncomplicated: Secondary | ICD-10-CM | POA: Diagnosis not present

## 2023-07-12 DIAGNOSIS — F17200 Nicotine dependence, unspecified, uncomplicated: Secondary | ICD-10-CM | POA: Diagnosis not present

## 2023-07-12 DIAGNOSIS — F1510 Other stimulant abuse, uncomplicated: Secondary | ICD-10-CM | POA: Diagnosis not present

## 2023-07-12 DIAGNOSIS — F1120 Opioid dependence, uncomplicated: Secondary | ICD-10-CM | POA: Diagnosis not present

## 2023-09-12 DIAGNOSIS — F1120 Opioid dependence, uncomplicated: Secondary | ICD-10-CM | POA: Diagnosis not present

## 2023-09-12 DIAGNOSIS — K5903 Drug induced constipation: Secondary | ICD-10-CM | POA: Diagnosis not present

## 2023-09-12 DIAGNOSIS — F1510 Other stimulant abuse, uncomplicated: Secondary | ICD-10-CM | POA: Diagnosis not present

## 2023-09-12 DIAGNOSIS — F17200 Nicotine dependence, unspecified, uncomplicated: Secondary | ICD-10-CM | POA: Diagnosis not present

## 2023-10-19 DIAGNOSIS — F1590 Other stimulant use, unspecified, uncomplicated: Secondary | ICD-10-CM | POA: Diagnosis not present

## 2023-10-19 DIAGNOSIS — F1120 Opioid dependence, uncomplicated: Secondary | ICD-10-CM | POA: Diagnosis not present

## 2023-10-19 DIAGNOSIS — K5903 Drug induced constipation: Secondary | ICD-10-CM | POA: Diagnosis not present

## 2023-10-19 DIAGNOSIS — F17200 Nicotine dependence, unspecified, uncomplicated: Secondary | ICD-10-CM | POA: Diagnosis not present
